# Patient Record
Sex: Male | Born: 1980
Health system: Southern US, Community
[De-identification: ages and names within clinical notes are randomized; demographics above are authoritative.]

## PROBLEM LIST (undated history)

## (undated) DIAGNOSIS — Z8619 Personal history of other infectious and parasitic diseases: Secondary | ICD-10-CM

## (undated) DIAGNOSIS — E669 Obesity, unspecified: Secondary | ICD-10-CM

## (undated) HISTORY — DX: Obesity, unspecified: E66.9

## (undated) HISTORY — DX: Personal history of other infectious and parasitic diseases: Z86.19

---
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
## 2001-11-19 ENCOUNTER — Emergency Department (HOSPITAL_COMMUNITY): Admission: EM | Admit: 2001-11-19 | Discharge: 2001-11-19 | Payer: Self-pay

## 2004-09-01 ENCOUNTER — Emergency Department: Payer: Self-pay | Admitting: Emergency Medicine

## 2004-09-01 IMAGING — CT CT HEAD WITHOUT CONTRAST
1 series · 16 of 30 positions shown, 20 images · non-contrast
Comparison: none

REASON FOR EXAM: Headache  [HOSPITAL]
COMMENTS:

[Series 2: without · axial · non-contrast · 0.42mm/px · z∈[+138,+272]mm · 16 of 30 slices shown, 20 images]
[im 2/30  brain]
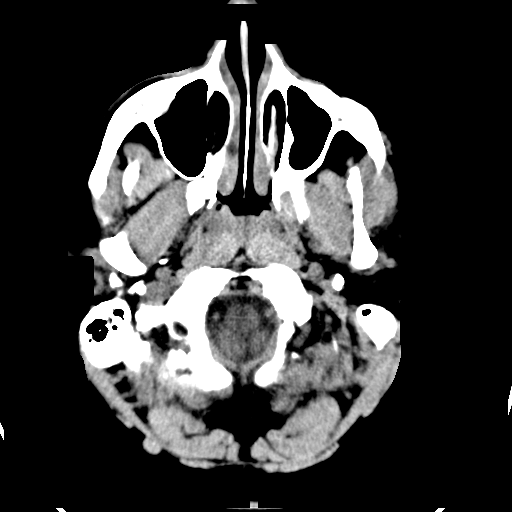
[im 2/30  bone]
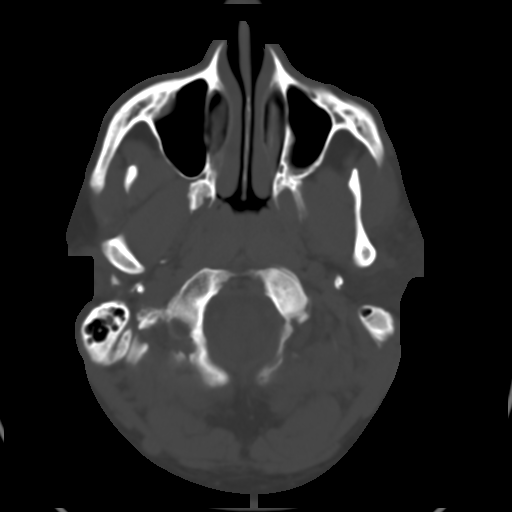
[im 4/30  brain]
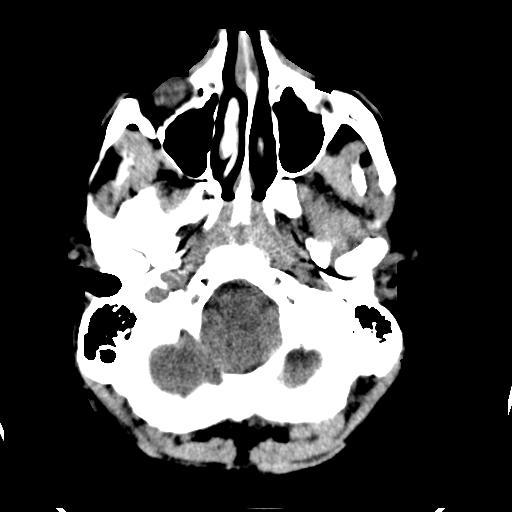
[im 6/30  brain]
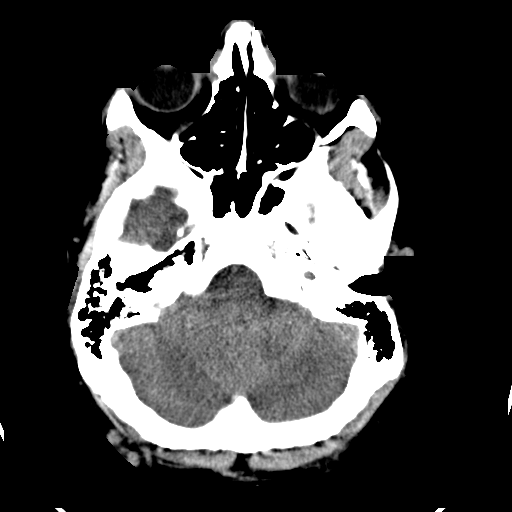
[im 8/30  brain]
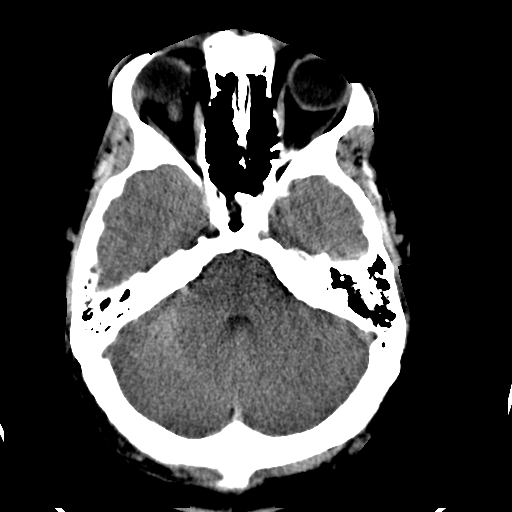
[im 9/30  brain]
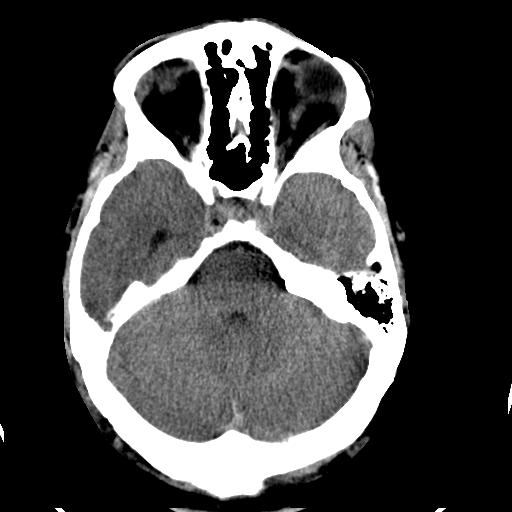
[im 9/30  bone]
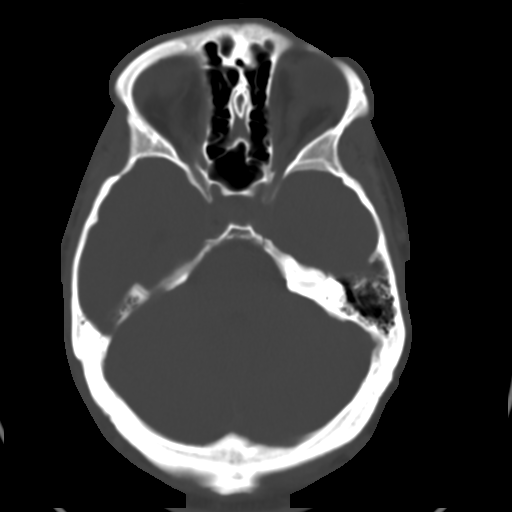
[im 11/30  brain]
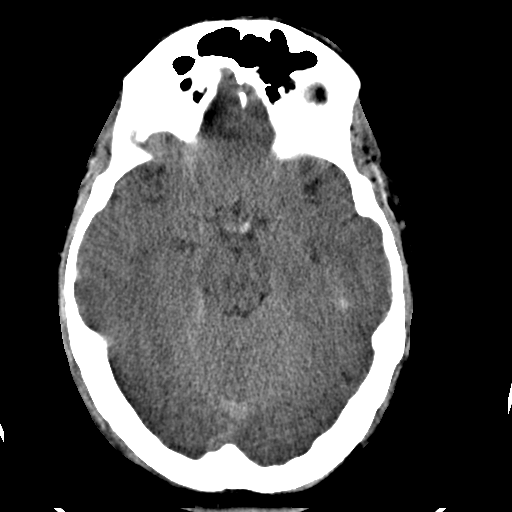
[im 13/30  brain]
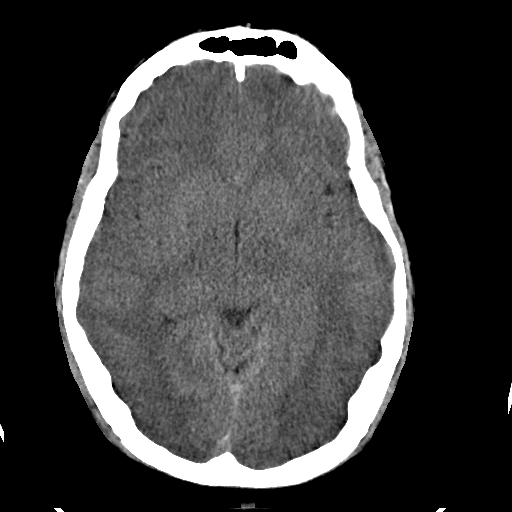
[im 15/30  brain]
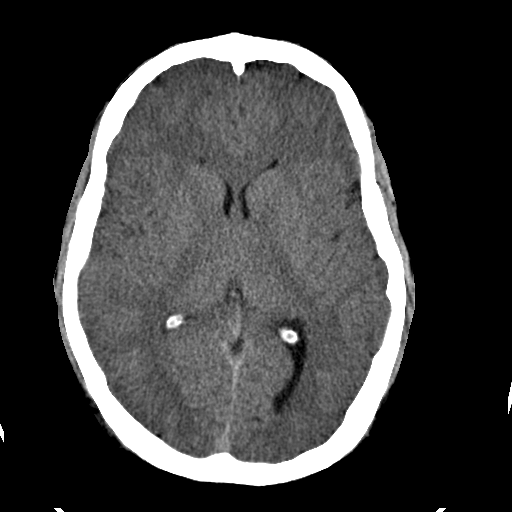
[im 16/30  brain]
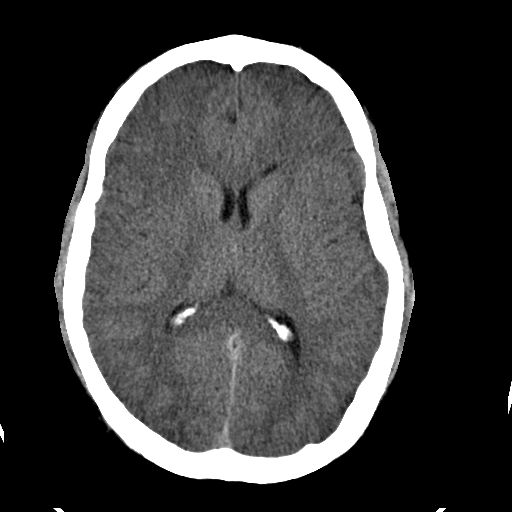
[im 16/30  bone]
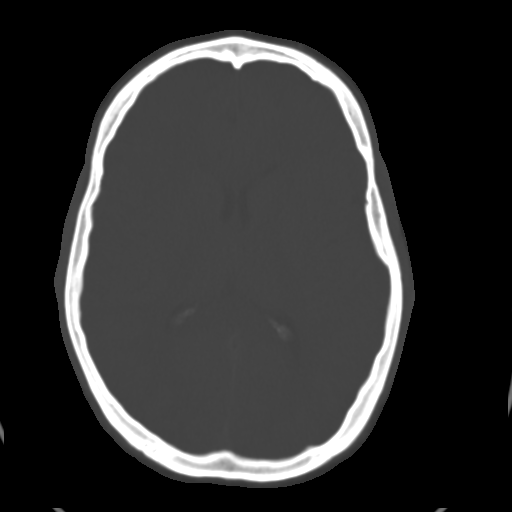
[im 18/30  brain]
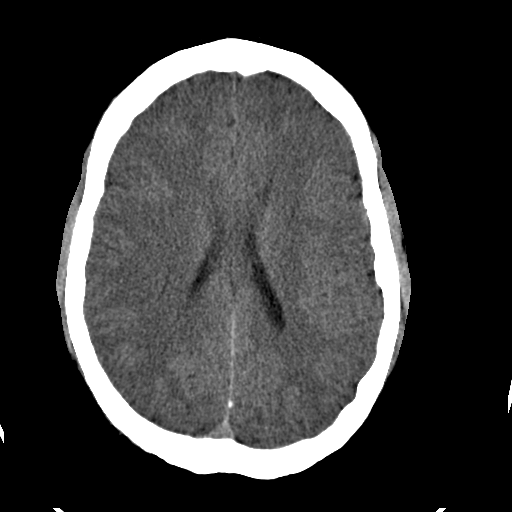
[im 20/30  brain]
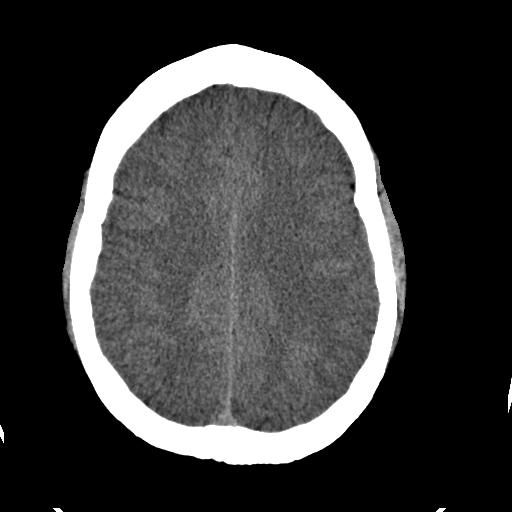
[im 22/30  brain]
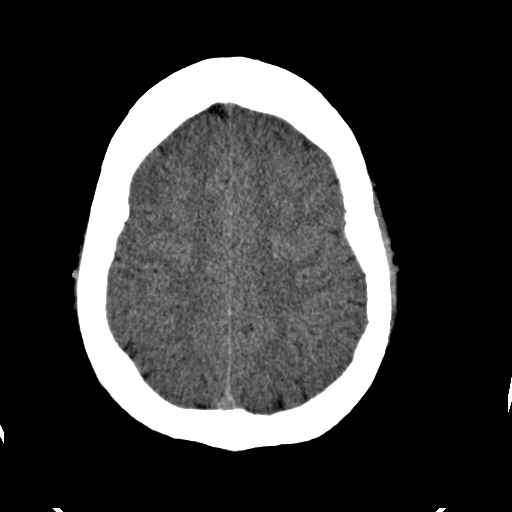
[im 23/30  brain]
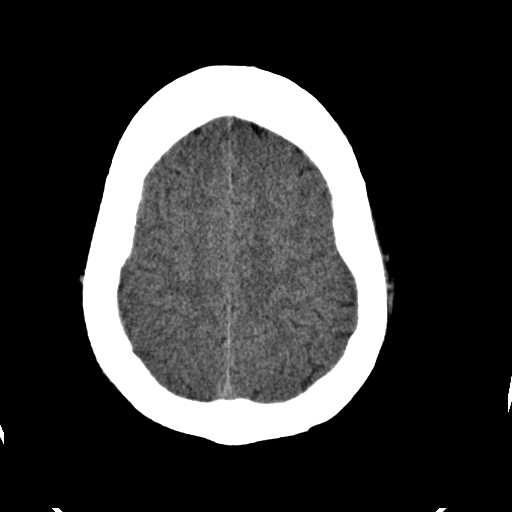
[im 23/30  bone]
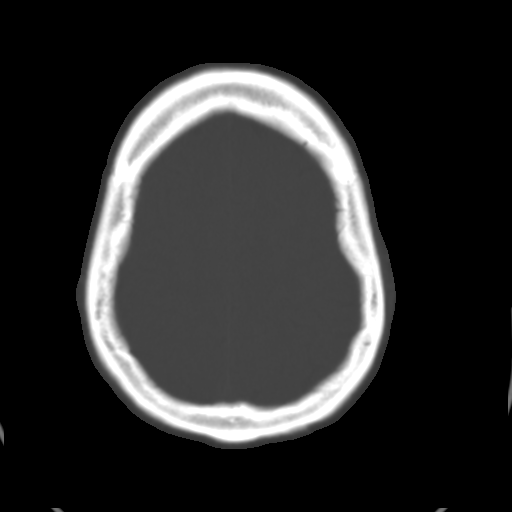
[im 25/30  brain]
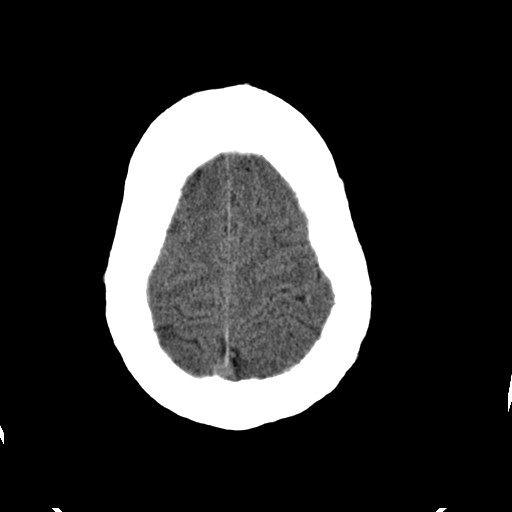
[im 27/30  brain]
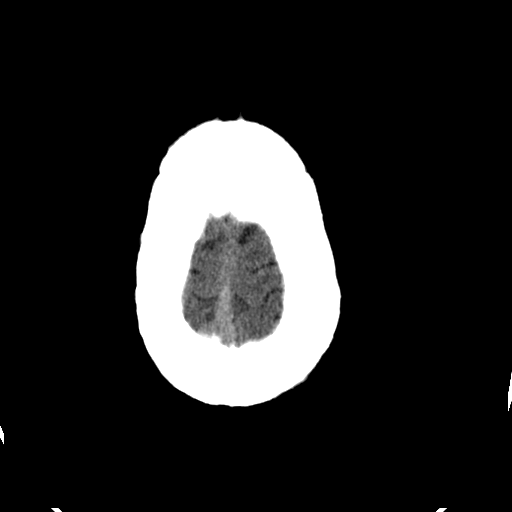
[im 29/30  brain]
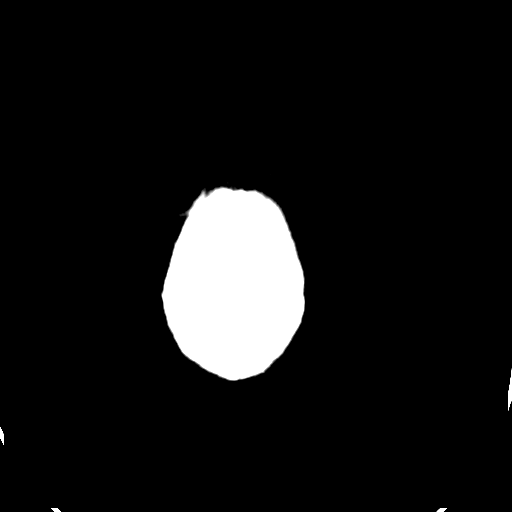

[16 of 30 positions shown; findings below may reference images not displayed]

PROCEDURE:     CT  - CT HEAD WITHOUT CONTRAST  - [DATE]  [DATE]

RESULT:     The patient is complaining of three days of headache.

The ventricles are normal in size and position.  I see no evidence of a mass
or of mass effect.  There are no findings suspicious for acute intracranial
hemorrhage.  At bone window settings there is minimal mucoperiosteal
thickening inferiorly in the LEFT maxillary sinus.  Otherwise, the paranasal
sinuses appear clear.
IMPRESSION: I do not see evidence of an acute intracranial abnormality.  Specifically I
do not see findings suspicious for hemorrhage or mass effect.  If the
patient's symptoms persist and remain unexplained, further evaluation with
MRI may be of value.

There is minimal mucoperiosteal thickening inferiorly in the LEFT maxillary
sinus.

The findings were called to the emergency room at the conclusion of the
study.

## 2004-09-02 ENCOUNTER — Ambulatory Visit: Payer: Self-pay | Admitting: Family Medicine

## 2005-07-22 ENCOUNTER — Ambulatory Visit: Payer: Self-pay | Admitting: Family Medicine

## 2005-08-01 ENCOUNTER — Ambulatory Visit: Payer: Self-pay | Admitting: Family Medicine

## 2006-09-05 ENCOUNTER — Emergency Department (HOSPITAL_COMMUNITY): Admission: EM | Admit: 2006-09-05 | Discharge: 2006-09-05 | Payer: Self-pay | Admitting: Family Medicine

## 2007-11-23 ENCOUNTER — Ambulatory Visit: Payer: Self-pay | Admitting: Internal Medicine

## 2007-11-23 DIAGNOSIS — J020 Streptococcal pharyngitis: Secondary | ICD-10-CM | POA: Insufficient documentation

## 2007-11-23 LAB — CONVERTED CEMR LAB: Rapid Strep: POSITIVE

## 2010-07-13 ENCOUNTER — Encounter
Admission: RE | Admit: 2010-07-13 | Discharge: 2010-07-13 | Payer: Self-pay | Source: Home / Self Care | Attending: Occupational Medicine | Admitting: Occupational Medicine

## 2010-07-13 IMAGING — CR DG CHEST 1V
1 series · 1 of 1 positions shown · non-contrast
Comparison: None.

CLINICAL DATA: Pre employment respiratory exam

CHEST - 1 VIEW

[w chest pa]
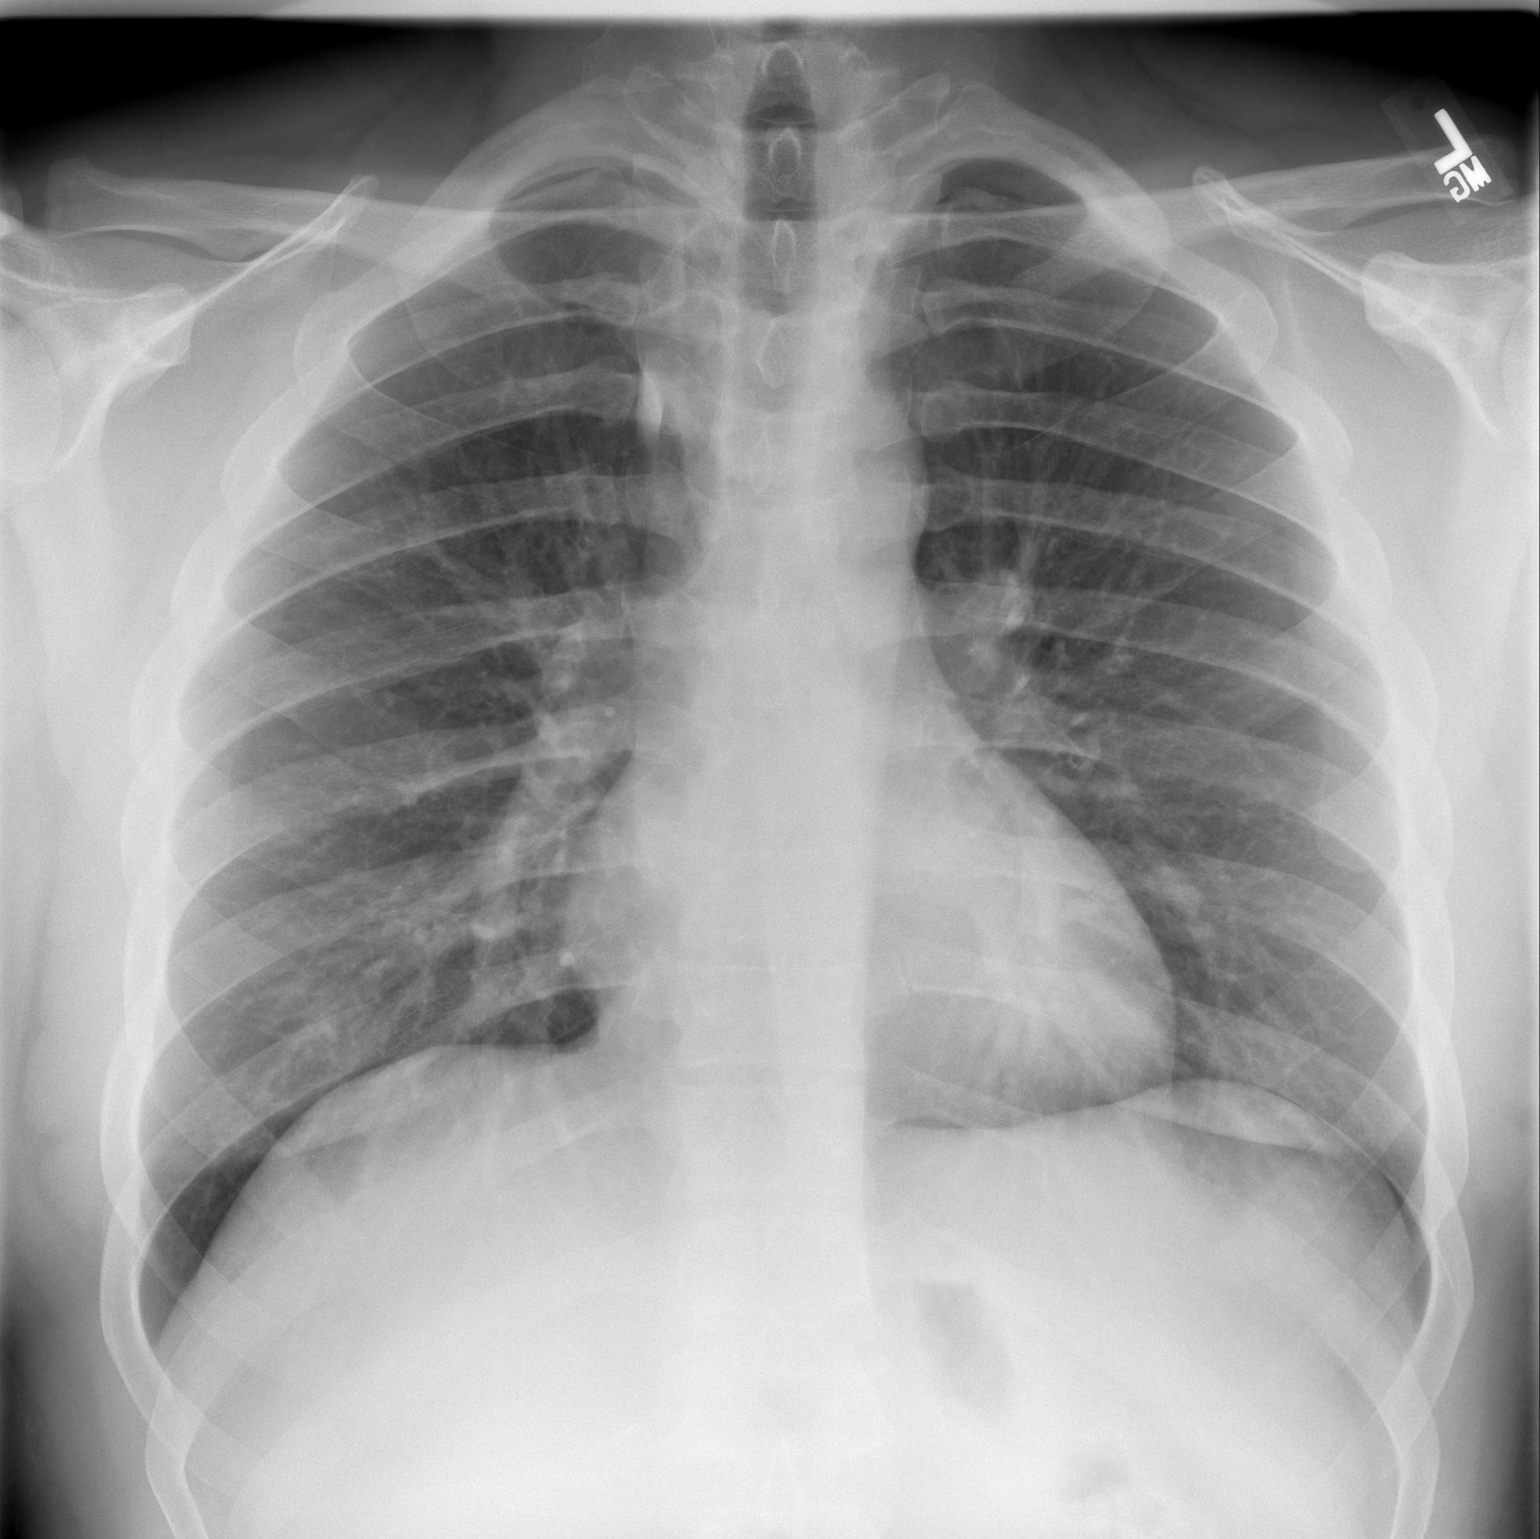

[1 of 1 positions shown; findings below may reference images not displayed]

FINDINGS: The cardiac silhouette, mediastinum, pulmonary
vasculature are within normal limits.  Both lungs are clear.
There is no acute bony abnormality.
IMPRESSION: There is no evidence of acute cardiac or pulmonary process.

## 2011-05-03 ENCOUNTER — Ambulatory Visit (INDEPENDENT_AMBULATORY_CARE_PROVIDER_SITE_OTHER): Payer: 59 | Admitting: Family Medicine

## 2011-05-03 ENCOUNTER — Encounter: Payer: Self-pay | Admitting: Family Medicine

## 2011-05-03 VITALS — BP 126/76 | HR 52 | Temp 98.6°F | Ht 76.0 in | Wt 337.8 lb

## 2011-05-03 DIAGNOSIS — R632 Polyphagia: Secondary | ICD-10-CM

## 2011-05-03 DIAGNOSIS — M542 Cervicalgia: Secondary | ICD-10-CM

## 2011-05-03 DIAGNOSIS — Z23 Encounter for immunization: Secondary | ICD-10-CM

## 2011-05-03 DIAGNOSIS — E669 Obesity, unspecified: Secondary | ICD-10-CM

## 2011-05-03 DIAGNOSIS — M545 Low back pain, unspecified: Secondary | ICD-10-CM | POA: Insufficient documentation

## 2011-05-03 LAB — GLUCOSE, POCT (MANUAL RESULT ENTRY): POC Glucose: 86

## 2011-05-03 MED ORDER — NAPROXEN 500 MG PO TABS: ORAL_TABLET | ORAL | Status: AC

## 2011-05-03 MED ORDER — CYCLOBENZAPRINE HCL 10 MG PO TABS: 10.0000 mg | ORAL_TABLET | Freq: Three times a day (TID) | ORAL | Status: AC | PRN

## 2011-05-03 NOTE — Progress Notes (Signed)
Subjective:    Patient ID: Melvin Hill, male    DOB: 1980/08/02, 30 y.o.   MRN: [id number]  HPI CC: new pt, re establish  Neck pain - 1-2 wk h/o neck pain described as constant tightness mid neck as well as down sides to shoulders.  Denies inciting trauma, injury.  No radiculopathy down arms or numbness, weakness.  Then back started hurting yesterday.  Lower back, left side.  No radiculopathy down legs, tingling or numbness down legs, fevers/chills, bowel or bladder changes, nausea/vomiting.  Back pain started after lifting something with waist yesterday.  Laying on back and pulling knees to chest helps a lot.  Similar episode of lower back pain happened 2 years ago when lifting something heavy with waist.  Treated back then with heat, icy hot, and slowly resolved.  Lasted 2 days.  Endorses polyphagia.  No polydipsia, polyuria.  Preventative: Did have CPE 07/2010, blood work checked.  Thinks had tetanus then. Would like flu shot. Had cereal today.  Medications and allergies reviewed and updated in chart.  Past histories reviewed and updated if relevant as below. Patient Active Problem List  Diagnoses  . PHARYNGITIS, STREPTOCOCCAL   Past Medical History  Diagnosis Date  . History of chicken pox   . Obesity    Past Surgical History  Procedure Date  . Anterior cruciate ligament repair 2001    Left knee   History  Substance Use Topics  . Smoking status: Never Smoker   . Smokeless tobacco: Never Used  . Alcohol Use: Yes     Very rare   Family History  Problem Relation Age of Onset  . Diabetes Father     borderline  . Diabetes Maternal Grandfather   . Stroke Maternal Grandfather   . Diabetes Maternal Grandmother   . Coronary artery disease Maternal Grandmother   . Cancer Neg Hx   . Hypertension Neg Hx    No Known Allergies No current outpatient prescriptions on file prior to visit.   Review of Systems  Constitutional: Negative for fever, chills, activity change,  appetite change, fatigue and unexpected weight change.  HENT: Negative for hearing loss and neck pain.   Eyes: Negative for visual disturbance.  Respiratory: Negative for cough, chest tightness, shortness of breath and wheezing.   Cardiovascular: Negative for chest pain, palpitations and leg swelling.  Gastrointestinal: Negative for nausea, vomiting, abdominal pain, diarrhea, constipation, blood in stool and abdominal distention.  Genitourinary: Negative for hematuria and difficulty urinating.  Musculoskeletal: Negative for myalgias and arthralgias.  Skin: Negative for rash.  Neurological: Negative for dizziness, seizures, syncope and headaches.  Hematological: Does not bruise/bleed easily.  Psychiatric/Behavioral: Negative for dysphoric mood. The patient is not nervous/anxious.        Objective:   Physical Exam  Nursing note and vitals reviewed. Constitutional: He is oriented to person, place, and time. He appears well-developed and well-nourished. No distress.       Obesity  HENT:  Head: Normocephalic and atraumatic.  Right Ear: External ear normal.  Left Ear: External ear normal.  Nose: Nose normal.  Mouth/Throat: Oropharynx is clear and moist.  Eyes: Conjunctivae and EOM are normal. Pupils are equal, round, and reactive to light.  Neck: Normal range of motion. Neck supple. No thyromegaly present.  Cardiovascular: Normal rate, regular rhythm, normal heart sounds and intact distal pulses.   No murmur heard. Pulses:      Radial pulses are 2+ on the right side, and 2+ on the left side.  Pulmonary/Chest: Effort normal and breath sounds normal. No respiratory distress. He has no wheezes. He has no rales.  Abdominal: Soft. Bowel sounds are normal. He exhibits no distension and no mass. There is no tenderness. There is no rebound and no guarding.  Musculoskeletal: Normal range of motion.       Mild midline spine tenderness at T1 Neg spurling. Tightness in traps bilaterally. FROM at  neck without pain or tightness. No midline spine lumbar tenderness. Tight paraspinous mm tenderness. Neg SLR.  No pain with int/ext rotation at hips, neg FABER test.  No GTB pain.  Mild SIJ pain bilaterally 5/5 strength bilat U and LE.  Lymphadenopathy:    He has no cervical adenopathy.  Neurological: He is alert and oriented to person, place, and time. He has normal strength. No sensory deficit.  Reflex Scores:      Bicep reflexes are 2+ on the right side and 2+ on the left side.      Patellar reflexes are 2+ on the right side and 2+ on the left side.      Achilles reflexes are 2+ on the right side and 2+ on the left side.      CN grossly intact, station and gait intact  Skin: Skin is warm and dry. No rash noted.  Psychiatric: He has a normal mood and affect. His behavior is normal. Judgment and thought content normal.   Random cbg = 86    Assessment & Plan:

## 2011-05-03 NOTE — Patient Instructions (Addendum)
Check on last blood work and tetanus shot to see when you're next due and let me know. Flu shot today. Random sugar today. Bring copy of recent blood work. I think you have cervical and lumbar strains.  I'd recommend anti inflammatory twice daily with food as well as muscle relaxant (can make you sleepy).  Continue ice/heat to area. If not improving, let me know.

## 2011-05-03 NOTE — Assessment & Plan Note (Signed)
Lumbar strain vs sacroiliitis.  Treat with nsaids and flexeril, ice/heat. Stretching exercises on lower back pain from [facility] pt advisor provided. No red flags. Update [place] if not improving as expected.

## 2011-05-03 NOTE — Assessment & Plan Note (Signed)
Discussed importance of weight loss in setting of fmhx DM, and back pain, as well as for prevention of arthritis, joint/disc damage. Pt seems to stay active, priority may be more healthy eating - watching portion sizes.  discussed this. Given endorsing polyphagia - checked random cbg = 86. Asked pt to bring copy of latest blood work (early this year) and I will review and see if needs any further testing.

## 2011-05-03 NOTE — Assessment & Plan Note (Signed)
Anticipate cervical strain. Provided with [facility] pt advisor stretches for this.  Treat with NSAIDs, flexeril, ice/heat. If not improved with this, obtain neck xray and consider PT referral. No red flags today.

## 2011-05-16 ENCOUNTER — Telehealth: Payer: Self-pay | Admitting: Family Medicine

## 2011-05-16 DIAGNOSIS — R7401 Elevation of levels of liver transaminase levels: Secondary | ICD-10-CM | POA: Insufficient documentation

## 2011-05-16 NOTE — Telephone Encounter (Signed)
Please notify I reviewed blood work from earlier this year - kidneys, thyroid, blood count normal. Cholesterol overall normal, but good chol a bit low - rec increased aerobic exercising. Liver enzymes were slightly elevated, would like to recheck this at his convenience.  Order in chart.

## 2011-05-17 NOTE — Telephone Encounter (Signed)
Patient notified. He will call back to schedule labs after he knows his schedule.

## 2014-03-18 ENCOUNTER — Ambulatory Visit (INDEPENDENT_AMBULATORY_CARE_PROVIDER_SITE_OTHER): Payer: 59 | Admitting: Internal Medicine

## 2014-03-18 ENCOUNTER — Encounter: Payer: Self-pay | Admitting: Internal Medicine

## 2014-03-18 VITALS — BP 142/68 | HR 63 | Temp 98.2°F | Wt 333.8 lb

## 2014-03-18 DIAGNOSIS — M542 Cervicalgia: Secondary | ICD-10-CM

## 2014-03-18 DIAGNOSIS — G44209 Tension-type headache, unspecified, not intractable: Secondary | ICD-10-CM

## 2014-03-18 MED ORDER — METHOCARBAMOL 500 MG PO TABS: 500.0000 mg | ORAL_TABLET | Freq: Four times a day (QID) | ORAL | Status: DC

## 2014-03-18 NOTE — Progress Notes (Signed)
   Subjective:    Patient ID: Melvin Hill, male    DOB: May 16, 1981, 33 y.o.   MRN: [id number]  HPI  Pt presents to the clinic today with c/o a headache. He reports this started 1 week ago. He describes the pain as a throbbing sensation. It is located behind his eyes and he feels tightness in his neck that radiates up to his head. He denies blurred vision. He denies nausea, sensitivity to light or sounds. He has tried Excedrin and Aleve without much improvement. He reports he has not had a headache like this since he was in college.  Review of Systems      Past Medical History  Diagnosis Date  . History of chicken pox   . Obesity     No current outpatient prescriptions on file.   No current facility-administered medications for this visit.    No Known Allergies  Family History  Problem Relation Age of Onset  . Diabetes Father     borderline  . Diabetes Maternal Grandfather   . Stroke Maternal Grandfather   . Diabetes Maternal Grandmother   . Coronary artery disease Maternal Grandmother   . Cancer Neg Hx   . Hypertension Neg Hx     History   Social History  . Marital Status: Single    Spouse Name: N/A    Number of Children: 0  . Years of Education: N/A   Occupational History  . Firefighter Unemployed   Social History Main Topics  . Smoking status: Never Smoker   . Smokeless tobacco: Never Used  . Alcohol Use: Yes     Comment: Very rare  . Drug Use: No  . Sexual Activity: Not on file   Other Topics Concern  . Not on file   Social History Narrative   Caffeine: some soda, tea 2-3 cups/day   Lives with fiancee, 1 dog   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     Constitutional: Pt reports headache. Denies fever, malaise, fatigue, headache or abrupt weight changes.  Neurological: Denies dizziness,  difficulty with memory, difficulty with speech or problems with balance and coordination.   No other specific complaints in a complete review of systems (except as listed in HPI above).  Objective:   Physical Exam   BP 142/68  Pulse 63  Temp(Src) 98.2 F (36.8 C) (Oral)  Wt 333 lb 12 oz (151.388 kg)  SpO2 98% Wt Readings from Last 3 Encounters:  03/18/14 333 lb 12 oz (151.388 kg)  05/03/11 337 lb 12 oz (153.202 kg)  11/23/07 314 lb (142.429 kg)    General: Appears his stated age, well developed, well nourished in NAD. Cardiovascular: Normal rate and rhythm. S1,S2 noted.  No murmur, rubs or gallops noted. No JVD or BLE edema. No carotid bruits noted. Pulmonary/Chest: Normal effort and positive vesicular breath sounds. No respiratory distress. No wheezes, rales or ronchi noted.   Neurological: Alert and oriented.  Coordination normal. .        Assessment & Plan:   Headache and neck pain secondary to tension:  80 mg Depo IM today eRx for Robaxin to take as needed  RTC as needed or if pain persist or worsens

## 2014-03-18 NOTE — Patient Instructions (Addendum)

## 2014-03-18 NOTE — Progress Notes (Signed)
Pre visit review using our clinic review tool, if applicable. No additional management support is needed unless otherwise documented below in the visit note. 

## 2014-10-01 ENCOUNTER — Ambulatory Visit (INDEPENDENT_AMBULATORY_CARE_PROVIDER_SITE_OTHER): Payer: 59 | Admitting: Family Medicine

## 2014-10-01 ENCOUNTER — Encounter: Payer: Self-pay | Admitting: Family Medicine

## 2014-10-01 VITALS — BP 110/82 | HR 56 | Temp 98.1°F | Wt 340.0 lb

## 2014-10-01 DIAGNOSIS — M79643 Pain in unspecified hand: Secondary | ICD-10-CM | POA: Insufficient documentation

## 2014-10-01 DIAGNOSIS — R2232 Localized swelling, mass and lump, left upper limb: Secondary | ICD-10-CM | POA: Insufficient documentation

## 2014-10-01 DIAGNOSIS — M25529 Pain in unspecified elbow: Secondary | ICD-10-CM | POA: Insufficient documentation

## 2014-10-01 NOTE — Assessment & Plan Note (Signed)
Anticipate wear and tear OA type changes. rec trial glucosamine or vitamin D supplements and 1 wk course aleve 220mg  bid with food. Update with effect or if worsening pain.

## 2014-10-01 NOTE — Patient Instructions (Addendum)
I think we have several things going on: For finger nodule - let's monitor for now. If not resolved we could refer you to hand doctor for further eval/treatment. For arthritis type pain of finger joints - try vitamin D 1000 units or glucosamine supplement daily for 1 month to see if any improvement. May use advil/aleve as needed. For elbow pain - I think we have ulnar neuropathy bilaterally and left medial epicondylitis. Try exercises provided today. If not better, let me know. Start B12 vitamin or B complex vitamin for nerve health.

## 2014-10-01 NOTE — Progress Notes (Signed)
Pre visit review using our clinic review tool, if applicable. No additional management support is needed unless otherwise documented below in the visit note. 

## 2014-10-01 NOTE — Progress Notes (Signed)
BP 110/82 mmHg  Pulse 56  Temp(Src) 98.1 F (36.7 C) (Oral)  Wt 340 lb (154.223 kg)   CC: joint pains  Subjective:    Patient ID: Melvin Hill, male    DOB: [birth year], 34 y.o.   MRN: [id number]  HPI: Melvin Hill is a 34 y.o. male presenting on 10/01/2014 for Joint Pain   Not seen by myself since 2012.  Ongoing issue over last year of bilateral inner elbows R>L worse with lifting heavy equipment or closed grip pull-ups. Notices some paresthesias/numbness along ulnar distribution with prolonged writing or with prolonged flexion of arm. Also with finger joint weakness (thinks has decreased grip strength). Mainly R 3rd MCP. Even notices discomfort when picking up jug of milk or opening gatorade cap. Over last 1-2 mo noted small nodule noted L medial 3rd digit between MCP and PIP. Wedding band irritates nodule. Getting worse.  Pressure on mid forearm helps pain.  Sleeps on abdomen. Forearm paresthesias wake him up.  Denies inciting trauma/injury. No active synovitis, no weakness of arms, no rash, no red eyes.   Has tried no meds - doesn't like meds for this.   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 Interim medical history since our last visit reviewed. Allergies and medications reviewed and updated. No current outpatient prescriptions on file prior to visit.   No current facility-administered medications on file prior to visit.    Review of Systems Per HPI unless specifically indicated above     Objective:    BP 110/82 mmHg  Pulse 56  Temp(Src) 98.1 F (36.7 C) (Oral)  Wt 340 lb (154.223 kg)  Wt Readings from Last 3 Encounters:  10/01/14 340 lb (154.223 kg)  03/18/14 333 lb 12 oz (151.388 kg)  05/03/11 337 lb 12 oz (153.202 kg)    Physical Exam  Constitutional: He is oriented to person, place, and time. He appears  well-developed and well-nourished. No distress.  Muscular/obese  Musculoskeletal: He exhibits no edema.  No active synovitis - no joint inflammation - erythema edema or swelling noted. Tender to palpation L medial epicondyle.  FROM elbows, wrist, fingers  Neurological: He is alert and oriented to person, place, and time.  Reflex Scores:      Bicep reflexes are 1+ on the right side and 1+ on the left side. Grip strength intact. + paresthesias with percussion bilateral ulnar tunnels. No neck pain.  Skin: Skin is warm and dry. No rash noted.  Nursing note and vitals reviewed.  Results for orders placed or performed in visit on 05/03/11  POCT Glucose (CBG)  Result Value Ref Range   POC Glucose 86       Assessment & Plan:   Problem List Items Addressed This Visit    Hand pain    Anticipate wear and tear OA type changes. rec trial glucosamine or vitamin D supplements and 1 wk course aleve  bid with food. Update with effect or if worsening pain.      Finger mass, left    L 3rd medial digit between MCP and PIP. ?cyst vs other mass.  Actually has decreased in size since he stopped wearing wedding band. Pt will monitor for now and if becoming more bothersome will update [place] for referral to hand surgery. No indication of infection today.      Elbow pain - Primary    Anticipate  2 issues ongoing:  1. Bilateral ulnar neuropathy - suggested start vit B complex. Discussed if not better with this could consider NCS vs referral to ortho for further eval of compressive anatomy and need for surgical intervention. Currently mild. 2. L medial epicondylitis - placed in tennis elbow brace. rec aleve 220mg  bid x 1 wk with food then prn, update [place] with effect.          Follow up plan: Return if symptoms worsen or fail to improve.

## 2014-10-01 NOTE — Assessment & Plan Note (Signed)
L 3rd medial digit between MCP and PIP. ?cyst vs other mass.  Actually has decreased in size since he stopped wearing wedding band. Pt will monitor for now and if becoming more bothersome will update [place] for referral to hand surgery. No indication of infection today.

## 2014-10-01 NOTE — Assessment & Plan Note (Signed)
Anticipate 2 issues ongoing:  1. Bilateral ulnar neuropathy - suggested start vit B complex. Discussed if not better with this could consider NCS vs referral to ortho for further eval of compressive anatomy and need for surgical intervention. Currently mild. 2. L medial epicondylitis - placed in tennis elbow brace. rec aleve 220mg  bid x 1 wk with food then prn, update [place] with effect.

## 2015-06-01 ENCOUNTER — Encounter: Payer: Self-pay | Admitting: Family Medicine

## 2015-06-01 ENCOUNTER — Ambulatory Visit (INDEPENDENT_AMBULATORY_CARE_PROVIDER_SITE_OTHER): Payer: 59 | Admitting: Family Medicine

## 2015-06-01 VITALS — BP 116/74 | HR 94 | Temp 98.5°F | Wt 345.4 lb

## 2015-06-01 DIAGNOSIS — R6883 Chills (without fever): Secondary | ICD-10-CM | POA: Diagnosis not present

## 2015-06-01 DIAGNOSIS — B349 Viral infection, unspecified: Secondary | ICD-10-CM | POA: Insufficient documentation

## 2015-06-01 LAB — POCT INFLUENZA A/B
Influenza A, POC: NEGATIVE
Influenza B, POC: NEGATIVE

## 2015-06-01 NOTE — Progress Notes (Signed)
Patient ID: Melvin Hill, male   DOB: 1980/12/24, 34 y.o.   MRN: [id number]  Marikay Alar, MD Phone: [phone]  Melvin Hill is a 34 y.o. male who presents today for same-day visit.  Patient notes onset of nasal congestion and postnasal drip last night. He noted a mild sinus pressure headache with this as well. Then today while at work he developed sore throat, chills and sweats, nausea, dry heaving, and fatigue. He notes mild frontal headache that is described as sinus pressure. Has not had any fevers documented. He has not vomited or had diarrhea. He has not had abdominal pain. Does note feeling achy all over though particularly in his low back. He notes this is similar to his prior history of back pain. He has not had any numbness or weakness. He has not had saddle anesthesia, bowel or bladder incontinence, or history of cancer. Has not had any vision changes. Denies urinary issues. 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 He has had no chest pain. No shortness of breath at this time. Overall feels tired. Daughter is his sick contact. States this feels like his prior viral illnesses.  PMH: nonsmoker.   ROS see history of present illness  Objective  Physical Exam Filed Vitals:   06/01/15 1405  BP: 116/74  Pulse: 94  Temp: 98.5 F (36.9 C)    Physical Exam  Constitutional:  No acute distress, nontoxic appearing, appears tired  HENT:  Head: Normocephalic and atraumatic.  Right Ear: External ear normal.  Left Ear: External ear normal.  Mouth/Throat: Oropharynx is clear and moist. No oropharyngeal exudate.  Normal TMs bilaterally, no tenderness to percussion of frontal and maxillary sinuses  Eyes: Conjunctivae are normal. Pupils are equal, round, and reactive to light.  Neck: Normal range of motion. Neck supple.  Cardiovascular: Normal rate, regular rhythm and normal heart sounds.  Exam reveals no gallop and no friction rub.     No murmur heard. Pulmonary/Chest: Effort normal and breath sounds normal. No respiratory distress. He has no wheezes. He has no rales.  Abdominal: Soft. Bowel sounds are normal. He exhibits no distension. There is no tenderness. There is no rebound and no guarding.  Musculoskeletal: He exhibits no edema.  No midline spine or neck tenderness, no spine step-off, no muscular back tenderness, no swelling in his back or erythema  Lymphadenopathy:    He has no cervical adenopathy.  Neurological: He is alert. Gait normal.  CN 2-12 intact, 5/5 strength in bilateral biceps, triceps, grip, quads, hamstrings, plantar and dorsiflexion, sensation to light touch intact in bilateral UE and LE, normal gait, 2+ patellar reflexes  Skin: Skin is warm and dry.     Assessment/Plan: Please see individual problem list.  Viral illness History consistent with viral upper respiratory flulike illness. No focal findings on exam to indicate bacterial infection. Patient's vitals are stable and he is afebrile. He is nontoxic appearing. Rapid flu negative. Had discussion with patient that this is likely a viral illness. Advised on hydration and ibuprofen for aching and fever. Advised on hygiene. He is given return precautions. He is not improved in the next 1-2 days he'll follow-up in the office.    Orders Placed This Encounter  Procedures  . POCT Influenza A/B   Marikay Alar

## 2015-06-01 NOTE — Progress Notes (Signed)
Pre visit review using our clinic review tool, if applicable. No additional management support is needed unless otherwise documented below in the visit note. 

## 2015-06-01 NOTE — Assessment & Plan Note (Addendum)
History consistent with viral upper respiratory flulike illness. No focal findings on exam to indicate bacterial infection. Patient's vitals are stable and he is afebrile. He is nontoxic appearing. Rapid flu negative. Had discussion with patient that this is likely a viral illness. Advised on hydration and ibuprofen for aching and fever. Advised on hygiene. He is given return precautions. He is not improved in the next 1-2 days he'll follow-up in the office.

## 2015-06-01 NOTE — Patient Instructions (Signed)
Nice to meet you. Your symptoms are consistent with a viral flu like illness.  Please stay well hydrated. You can take ibuprofen for any discomfort. If you develop fevers, shortness of breath, chest pain, abdominal pain, diarrhea, vomiting, headaches, vision changes, numbness, weakness, back pain, rash, or any new or change in symptoms please seek medical attention. If not improving in the next 1-2 days please follow-up.

## 2016-08-08 ENCOUNTER — Telehealth: Payer: Self-pay | Admitting: Family Medicine

## 2016-08-08 NOTE — Telephone Encounter (Signed)
Noted. Scheduled appointment for 08/09/16.

## 2016-08-08 NOTE — Telephone Encounter (Signed)
[facility] Day - Client TELEPHONE ADVICE RECORD [facility] Patient Name: Melvin Hill DOB: [birth year] Initial Comment Caller states, has had a stuffy nose for two weeks and sinus pain. For two days he has a cough and last night he had chills. Chest cold. Headache. Verified Nurse Assessment Nurse: Debera Lat, RN, Tinnie Gens Date/Time Lamount Cohen Time): 08/08/2016 10:40:21 AM Confirm and document reason for call. If symptomatic, describe symptoms. ---Caller states, has had a stuffy nose for two weeks and sinus pain. For two days he has a cough and last night he had chills. Chest cold. No fever. Does the patient have any new or worsening symptoms? ---Yes Will a triage be completed? ---Yes Related visit to physician within the last 2 weeks? ---No Does the PT have any chronic conditions? (i.e. diabetes, asthma, etc.) ---No Is this a behavioral health or substance abuse call? ---No Guidelines Guideline Title Affirmed Question Affirmed Notes Sinus Pain or Congestion [1] Sinus congestion (pressure, fullness) AND [2] present > 10 days Final Disposition User See PCP When Office is Open (within 3 days) Debera Lat, RN, [employer] [facility] Referrals REFERRED TO PCP OFFICE Disagree/Comply: Danella Maiers

## 2016-08-09 ENCOUNTER — Encounter: Payer: Self-pay | Admitting: Primary Care

## 2016-08-09 ENCOUNTER — Ambulatory Visit (INDEPENDENT_AMBULATORY_CARE_PROVIDER_SITE_OTHER): Payer: 59 | Admitting: Primary Care

## 2016-08-09 VITALS — BP 124/74 | HR 68 | Temp 98.0°F | Wt 334.0 lb

## 2016-08-09 DIAGNOSIS — J019 Acute sinusitis, unspecified: Secondary | ICD-10-CM

## 2016-08-09 DIAGNOSIS — B9689 Other specified bacterial agents as the cause of diseases classified elsewhere: Secondary | ICD-10-CM

## 2016-08-09 MED ORDER — AMOXICILLIN-POT CLAVULANATE 875-125 MG PO TABS: 1.0000 | ORAL_TABLET | Freq: Two times a day (BID) | ORAL | 0 refills | Status: DC

## 2016-08-09 NOTE — Progress Notes (Signed)
Pre visit review using our clinic review tool, if applicable. No additional management support is needed unless otherwise documented below in the visit note. 

## 2016-08-09 NOTE — Progress Notes (Signed)
Subjective:    Patient ID: Melvin Hill, male    DOB: 08-May-1981, 36 y.o.   MRN: [id number]  HPI  Mr. Brakeman is a 36 year old male who presents today with a chief complaint of sinus pressure. He also reports nasal congestion, cough, ear fullness, fatigue, chills, night sweats. His symptoms have been present for the past 2 weeks. He's noticed increase in symptoms with productive cough with green sputum, chills over the past 2 days. He's been using Afrin QID and taking Dayquil without improvement. He's been exposed to a lot of sick people through his occupation and also through his young daughter.  Review of Systems  Constitutional: Positive for chills. Negative for fever.  HENT: Positive for congestion, sinus pain and sinus pressure. Negative for ear pain and sore throat.   Respiratory: Positive for cough. Negative for shortness of breath.   Cardiovascular: Negative for chest pain.       Past Medical History:  Diagnosis Date  . History of chicken pox   . Obesity      Social History   Social History  . Marital status: Single    Spouse name: N/A  . Number of children: 0  . Years of education: N/A   Occupational History  . Firefighter Unemployed   Social History Main Topics  . Smoking status: Never Smoker  . Smokeless tobacco: Never Used  . Alcohol use Yes     Comment: Very rare  . Drug use: No  . Sexual activity: Not on file   Other Topics Concern  . Not on file   Social History Narrative   Caffeine: some soda, tea 2-3 cups/day   Lives with fiancee, 1 dog   Was football lineman during college   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    Past Surgical History:  Procedure Laterality Date  . ANTERIOR CRUCIATE LIGAMENT REPAIR  2001   Left knee    Family History  Problem Relation Age of Onset  . Diabetes  Father     borderline  . Diabetes Maternal Grandfather   . Stroke Maternal Grandfather   . Diabetes Maternal Grandmother   . Coronary artery disease Maternal Grandmother   . Cancer Neg Hx   . Hypertension Neg Hx     No Known Allergies  No current outpatient prescriptions on file prior to visit.   No current facility-administered medications on file prior to visit.     BP 124/74   Pulse 68   Temp 98 F (36.7 C) (Oral)   Wt (!) 334 lb (151.5 kg)   SpO2 98%   BMI 40.66 kg/m    Objective:   Physical Exam  Constitutional: He appears well-nourished. He appears ill.  HENT:  Right Ear: Tympanic membrane and ear canal normal.  Left Ear: Tympanic membrane and ear canal normal.  Nose: Mucosal edema present. Right sinus exhibits maxillary sinus tenderness and frontal sinus tenderness. Left sinus exhibits no maxillary sinus tenderness and no frontal sinus tenderness.  Mouth/Throat: Posterior oropharyngeal erythema present.  Eyes: Conjunctivae are normal.  Neck: Neck supple.  Cardiovascular: Normal rate and regular rhythm.   Pulmonary/Chest: Effort normal and breath sounds normal. He has no wheezes. He has no rales.  Skin: Skin is warm and dry.          Assessment & Plan:  Acute  Sinusitis:  Sinus pressure, nasal congestion x 2 weeks. Now with productive cough. Exam today with sinus tenderness and nasal mucosal edema.  Given duration of symptoms and presentation, will treat for presumed bacterial involvement.  Rx for Augmentin course sent to pharmacy. Stop Afrin, try Flonase. Fluids, rest, follow up PRN.  Morrie Sheldon, NP

## 2016-08-09 NOTE — Patient Instructions (Signed)
You have a sinus infection.  Start Augmentin antibiotics. Take 1 tablet by mouth twice daily for 10 days with food.  Nasal Congestion/Sinus Pressure: Try using Flonase (fluticasone) nasal spray. Instill 1 spray in each nostril twice daily.   Continue Dayquil as needed for cough.  Ensure you're staying hydrated with water and rest.  It was a pleasure meeting you!

## 2018-06-08 ENCOUNTER — Ambulatory Visit: Payer: 59 | Admitting: Family Medicine

## 2018-06-08 DIAGNOSIS — Z0289 Encounter for other administrative examinations: Secondary | ICD-10-CM

## 2019-05-20 ENCOUNTER — Telehealth: Payer: Self-pay | Admitting: Family Medicine

## 2019-05-20 NOTE — Telephone Encounter (Signed)
E-mail sent to billing requesting information on if account can be recalled from collection agency.

## 2019-05-20 NOTE — Telephone Encounter (Signed)
Pt called stating he was sent to collections for 06/08/18.  he never received a bill  for the no show.  He stated this has dropped is credit score like 40 points.  He wanted to know if there was anything we could do about this.    I did look and his address was incorrect on the guarantee account.

## 2019-05-28 NOTE — Telephone Encounter (Signed)
I spoke with the patient. He stated that he moved in 2017. His last appointment with our office was in 2018 at which time he gave an updated address. He is not sure why it was not changed in the system. I will reach back out to patient accounting with the information received today to see if bill can be pulled from collections.

## 2019-05-28 NOTE — Telephone Encounter (Signed)
Left message on voicemail for patient to call the office. Need to inform patient that per patient accounting statements were sent out 12/15, 1/14, 2/13, & 3/15 to address [facility] [address], Altha Harm. We did not receive returned mail. The account has been in collections since April  and usually for that length of time the agency would have attempted to make contact. The account would not be returned from the agency.

## 2019-05-31 NOTE — Telephone Encounter (Signed)
Left message for patient to call office. Need to inform patient that he can call patient accounting to make his payment. Once the payment is made patient accounting will pull the account from collections.

## 2019-05-31 NOTE — Telephone Encounter (Signed)
I spoke with the patient and informed him that he would need to call patient accounting at [phone] to make his payment. Once the payment is made patient accounting will pull the account back from the agency. Patient understood.

## 2019-10-24 ENCOUNTER — Telehealth: Payer: Self-pay

## 2019-10-24 NOTE — Telephone Encounter (Signed)
Noted. Thanks.

## 2019-10-24 NOTE — Telephone Encounter (Signed)
Noted  

## 2019-10-24 NOTE — Telephone Encounter (Signed)
Pt scheduled 4/26 appointment with debbie Is aware of brooke's comment Pt stated he would try to find urgent care and go.  He didn't know which urgent care he would go to

## 2019-10-24 NOTE — Telephone Encounter (Signed)
Patient contacted the office this morning asking for an appointment to discuss his blood sugars. He states he has been checking his blood sugars in morning before he eats and he states they are running in the 130's and he states he has been feeling very groggy, with some slight abdominal pain. I advised patient I would need to speak with my manager to see what we could do for him, to get him taken care of appropriately - as it looks like the last time he was seen in the office in 2018, so he is considered a new patient.  After speaking with Carollee Herter, RN - she advised that patient could be scheduled for a NP appointment with one of our available providers, but for this issue concering his blood sugars, he needs to be seen at an Urgent Care.  I left a voicemail for patient to return phone call.

## 2019-10-26 ENCOUNTER — Other Ambulatory Visit: Payer: Self-pay

## 2019-10-26 ENCOUNTER — Emergency Department
Admission: EM | Admit: 2019-10-26 | Discharge: 2019-10-26 | Disposition: A | Discharge status: Home / Self Care | Payer: 59 | Attending: Emergency Medicine | Admitting: Emergency Medicine

## 2019-10-26 ENCOUNTER — Encounter: Payer: Self-pay | Admitting: Emergency Medicine

## 2019-10-26 ENCOUNTER — Emergency Department: Payer: 59

## 2019-10-26 DIAGNOSIS — R1030 Lower abdominal pain, unspecified: Secondary | ICD-10-CM | POA: Insufficient documentation

## 2019-10-26 DIAGNOSIS — R112 Nausea with vomiting, unspecified: Secondary | ICD-10-CM | POA: Insufficient documentation

## 2019-10-26 DIAGNOSIS — R1084 Generalized abdominal pain: Secondary | ICD-10-CM | POA: Insufficient documentation

## 2019-10-26 DIAGNOSIS — K5900 Constipation, unspecified: Secondary | ICD-10-CM

## 2019-10-26 DIAGNOSIS — R140 Abdominal distension (gaseous): Secondary | ICD-10-CM | POA: Insufficient documentation

## 2019-10-26 DIAGNOSIS — R197 Diarrhea, unspecified: Secondary | ICD-10-CM | POA: Insufficient documentation

## 2019-10-26 LAB — URINALYSIS, COMPLETE (UACMP) WITH MICROSCOPIC
Bacteria, UA: NONE SEEN
Bilirubin Urine: NEGATIVE
Glucose, UA: NEGATIVE mg/dL
Hgb urine dipstick: NEGATIVE
Ketones, ur: 5 mg/dL — AB
Leukocytes,Ua: NEGATIVE
Nitrite: NEGATIVE
Protein, ur: NEGATIVE mg/dL
Specific Gravity, Urine: 1.029 (ref 1.005–1.030)
Squamous Epithelial / HPF: NONE SEEN (ref 0–5)
pH: 5 (ref 5.0–8.0)

## 2019-10-26 LAB — CBC WITH DIFFERENTIAL/PLATELET
Abs Immature Granulocytes: 0.02 10*3/uL (ref 0.00–0.07)
Basophils Absolute: 0 10*3/uL (ref 0.0–0.1)
Basophils Relative: 1 %
Eosinophils Absolute: 0 10*3/uL (ref 0.0–0.5)
Eosinophils Relative: 1 %
HCT: 43.7 % (ref 39.0–52.0)
Hemoglobin: 15.3 g/dL (ref 13.0–17.0)
Immature Granulocytes: 0 %
Lymphocytes Relative: 31 %
Lymphs Abs: 2.4 10*3/uL (ref 0.7–4.0)
MCH: 30.1 pg (ref 26.0–34.0)
MCHC: 35 g/dL (ref 30.0–36.0)
MCV: 85.9 fL (ref 80.0–100.0)
Monocytes Absolute: 0.6 10*3/uL (ref 0.1–1.0)
Monocytes Relative: 7 %
Neutro Abs: 4.7 10*3/uL (ref 1.7–7.7)
Neutrophils Relative %: 60 %
Platelets: 294 10*3/uL (ref 150–400)
RBC: 5.09 MIL/uL (ref 4.22–5.81)
RDW: 12.4 % (ref 11.5–15.5)
WBC: 7.8 10*3/uL (ref 4.0–10.5)
nRBC: 0 % (ref 0.0–0.2)

## 2019-10-26 LAB — COMPREHENSIVE METABOLIC PANEL
ALT: 36 U/L (ref 0–44)
AST: 37 U/L (ref 15–41)
Albumin: 4.6 g/dL (ref 3.5–5.0)
Alkaline Phosphatase: 41 U/L (ref 38–126)
Anion gap: 7 (ref 5–15)
BUN: 19 mg/dL (ref 6–20)
CO2: 27 mmol/L (ref 22–32)
Calcium: 9.4 mg/dL (ref 8.9–10.3)
Chloride: 105 mmol/L (ref 98–111)
Creatinine, Ser: 1.04 mg/dL (ref 0.61–1.24)
GFR calc Af Amer: >60 mL/min (ref >60)
GFR calc non Af Amer: >60 mL/min (ref >60)
Glucose, Bld: 101 mg/dL — ABNORMAL HIGH (ref 70–99)
Potassium: 4.2 mmol/L (ref 3.5–5.1)
Sodium: 139 mmol/L (ref 135–145)
Total Bilirubin: 1.1 mg/dL (ref 0.3–1.2)
Total Protein: 7.2 g/dL (ref 6.5–8.1)

## 2019-10-26 LAB — LIPASE, BLOOD: Lipase: 21 U/L (ref 11–51)

## 2019-10-26 IMAGING — CR DG ABDOMEN 1V
1 series · 2 of 2 positions shown · non-contrast
Comparison: None.

CLINICAL DATA: Abdominal pain, constipation

EXAM:
ABDOMEN - 1 VIEW

[Series 1: dg abd 1 view · 0.14mm/px · 2 of 2 slices shown]
[im 1/2]
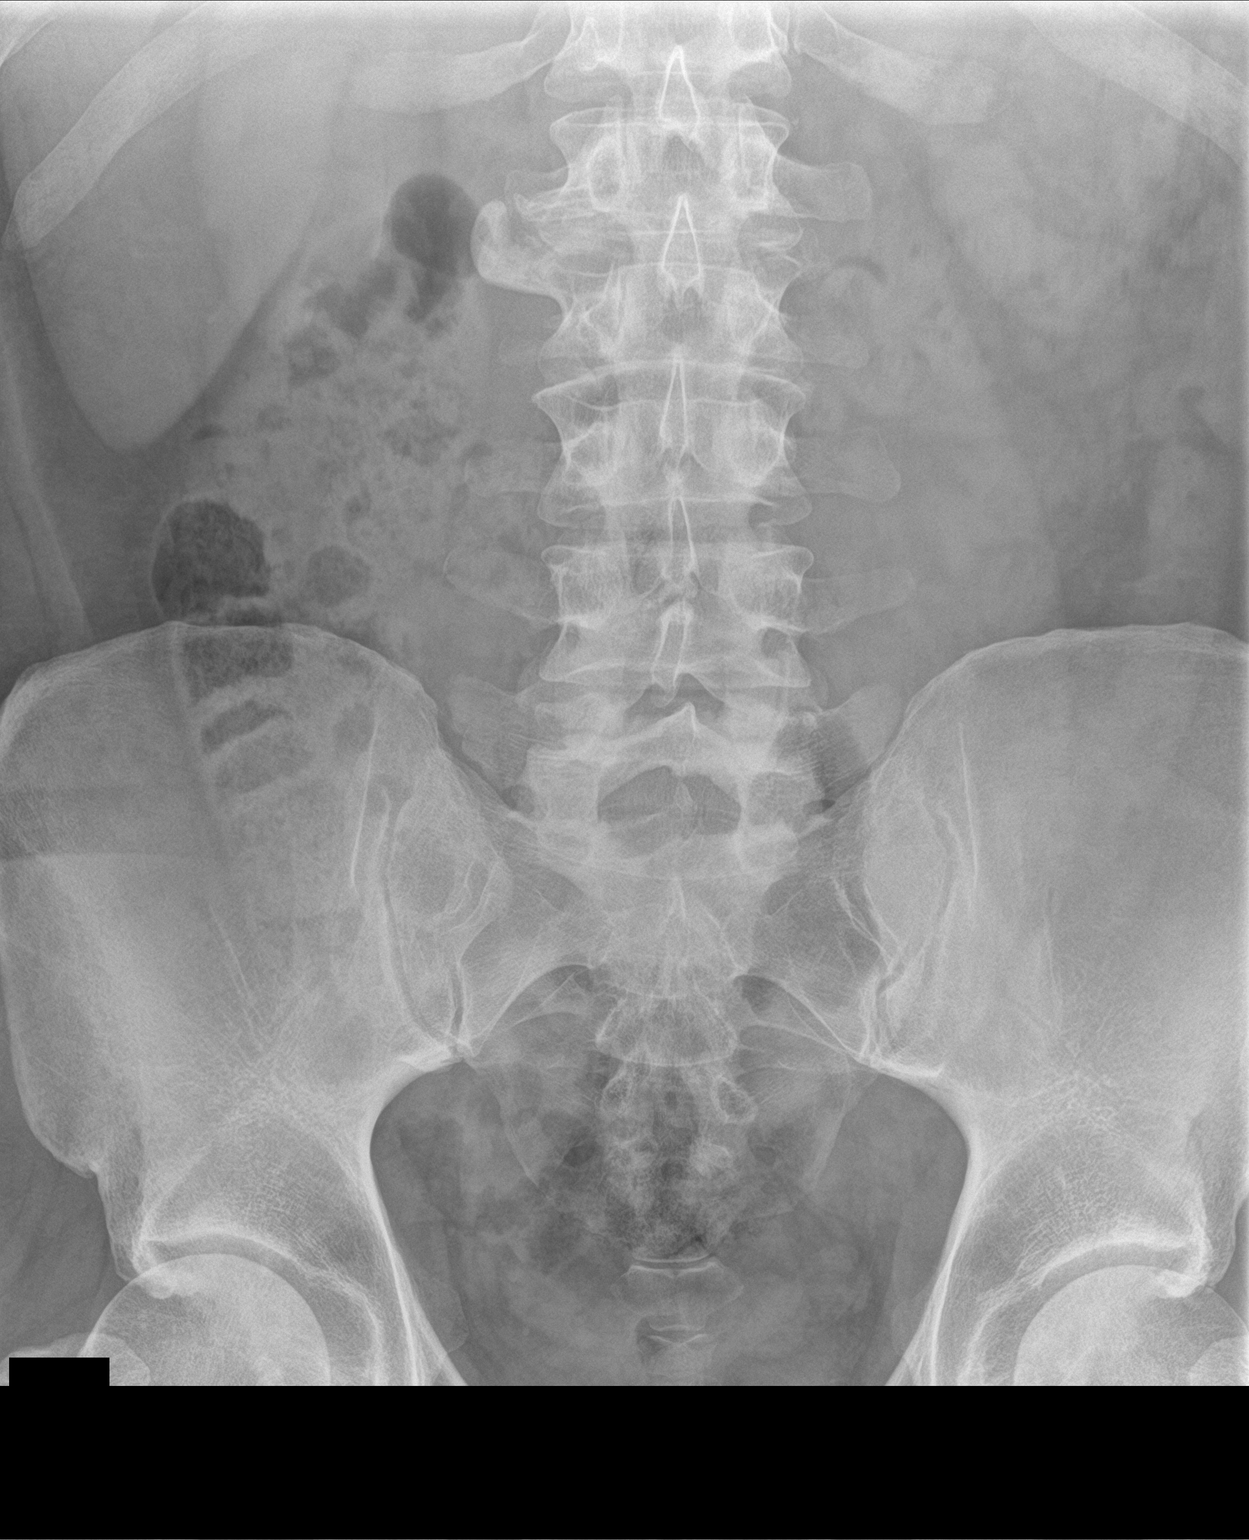
[im 2/2]
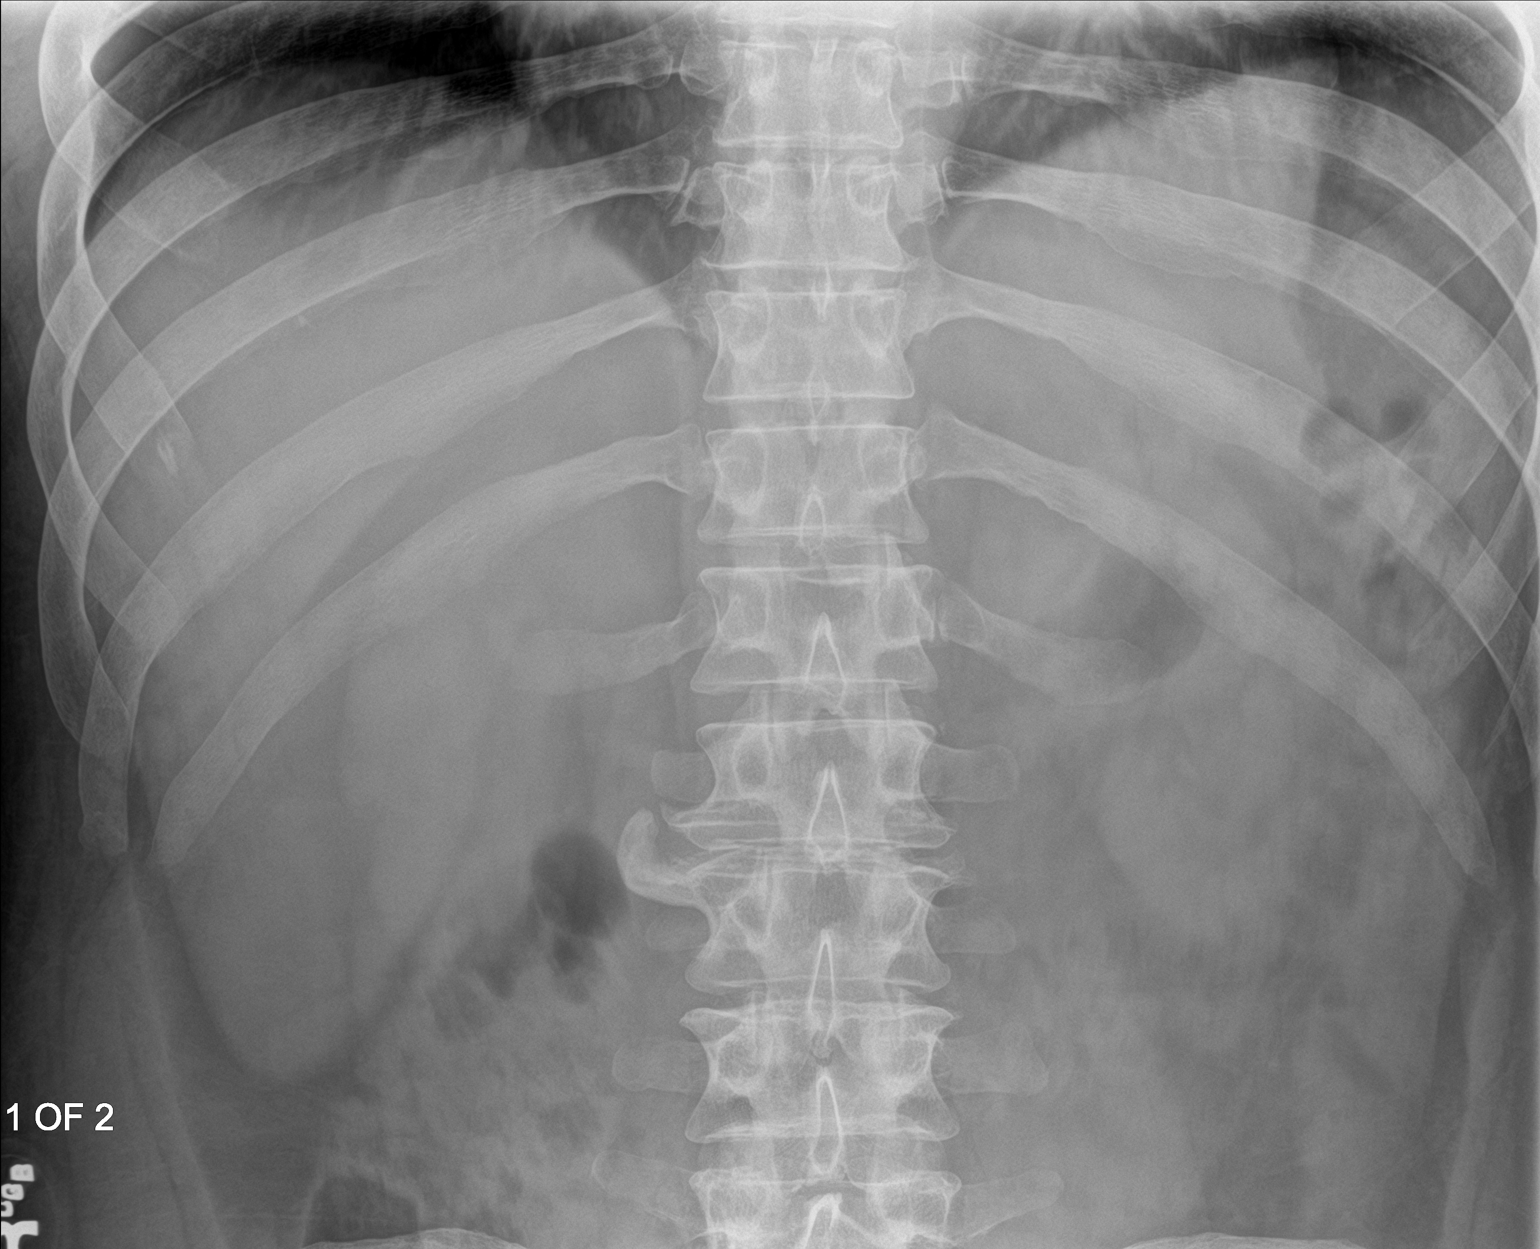

[2 of 2 positions shown; findings below may reference images not displayed]

FINDINGS: The bowel gas pattern is normal. Scattered stool in the colon. No
radio-opaque calculi or other significant radiographic abnormality
are seen.
IMPRESSION: Nonobstructive pattern of bowel gas. No large burden of stool in the
colon.

## 2019-10-26 NOTE — ED Triage Notes (Signed)
abd pain x 3 days. Family had a stomach bug that went thru everyone last week, which he had, but now feels constipated.

## 2019-10-26 NOTE — Discharge Instructions (Addendum)
Your exam, labs, and XR are normal at this time. Continue to monitor symptoms. Increase your fiber intake and consider OTC stool softener. Follow-up with  as planned. Return as needed.

## 2019-10-26 NOTE — ED Provider Notes (Signed)
[facility] Emergency Department Provider Note ____________________________________________  Time seen: 18  I have reviewed the triage vital signs and the nursing notes.  HISTORY  Chief Complaint  Abdominal Pain  HPI Melvin Hill is a 39 y.o. male presents himself to the ED for evaluation of  3 days of generalized lower abdominal pain.  Patient describes similar symptoms including nausea, vomiting, and diarrhea in his household contacts as well as his extended family members.  He reports continued feelings of bloating as well as flatulence.  Denies any ongoing nausea or vomiting.  Patient also denies any anorexia.  He is not take any medications in the interim for symptom relief denies any interim fevers, chills, sweats, chest pain, or reflux.  Past Medical History:  Diagnosis Date  . History of chicken pox   . Obesity     Patient Active Problem List   Diagnosis Date Noted  . Viral illness 06/01/2015  . Elbow pain 10/01/2014  . Hand pain 10/01/2014  . Finger mass, left 10/01/2014  . Transaminitis 05/16/2011  . Lower back pain 05/03/2011  . Obesity     Past Surgical History:  Procedure Laterality Date  . ANTERIOR CRUCIATE LIGAMENT REPAIR  2001   Left knee    Prior to Admission medications   Not on File    Allergies Patient has no known allergies.  Family History  Problem Relation Age of Onset  . Diabetes Father        borderline  . Diabetes Maternal Grandfather   . Stroke Maternal Grandfather   . Diabetes Maternal Grandmother   . Coronary artery disease Maternal Grandmother   . Cancer Neg Hx   . Hypertension Neg Hx     Social History Social History   Tobacco Use  . Smoking status: Never Smoker  . Smokeless tobacco: Never Used  Substance Use Topics  . Alcohol use: Yes    Comment: Very rare  . Drug use: No    Review of Systems  Constitutional: Negative for fever. Eyes: Negative for visual changes. ENT: Negative for sore  throat. Cardiovascular: Negative for chest pain. Respiratory: Negative for shortness of breath. Gastrointestinal: Positive for lower abdominal pain and bloating.  Denies any vomiting and diarrhea. Genitourinary: Negative for dysuria. Musculoskeletal: Negative for back pain. Skin: Negative for rash. Neurological: Negative for headaches, focal weakness or numbness. ____________________________________________  PHYSICAL EXAM:  VITAL SIGNS: ED Triage Vitals  Enc Vitals Group     BP 10/26/19 1627 136/77     Pulse Rate 10/26/19 1627 65     Resp 10/26/19 1627 20     Temp 10/26/19 1627 98.1 F (36.7 C)     Temp Source 10/26/19 1627 Oral     SpO2 10/26/19 1627 100 %     Weight 10/26/19 1629 (!) 315 lb (142.9 kg)     Height 10/26/19 1629 6\' 4"  (1.93 m)     Head Circumference --      Peak Flow --      Pain Score --      Pain Loc --      Pain Edu? --      Excl. in [place]? --     Constitutional: Alert and oriented. Well appearing and in no distress. Head: Normocephalic and atraumatic. Eyes: Conjunctivae are normal. Normal extraocular movements Cardiovascular: Normal rate, regular rhythm. Normal distal pulses. Respiratory: Normal respiratory effort. No wheezes/rales/rhonchi. Gastrointestinal: Soft and nontender. No distention, rebound, guarding, or rigidity.  Normal bowel sounds noted. Musculoskeletal: Nontender  with normal range of motion in all extremities.  Neurologic:  Normal gait without ataxia. Normal speech and language. No gross focal neurologic deficits are appreciated. Skin:  Skin is warm, dry and intact. No rash noted. Psychiatric: Mood and affect are normal. Patient exhibits appropriate insight and judgment. ____________________________________________   LABS (pertinent positives/negatives) Labs Reviewed  URINALYSIS, COMPLETE (UACMP) WITH MICROSCOPIC - Abnormal; Notable for the following components:      Result Value   Color, Urine YELLOW (*)    APPearance CLEAR (*)     Ketones, ur 5 (*)    All other components within normal limits  COMPREHENSIVE METABOLIC PANEL - Abnormal; Notable for the following components:   Glucose, Bld 101 (*)    All other components within normal limits  CBC WITH DIFFERENTIAL/PLATELET  LIPASE, BLOOD  ____________________________________________   RADIOLOGY  DG ABD 1 V IMPRESSION: Nonobstructive pattern of bowel gas. No large burden of stool in the colon. ____________________________________________  PROCEDURES  Procedures ____________________________________________  INITIAL IMPRESSION / ASSESSMENT AND PLAN / ED COURSE  Differential diagnosis includes, but is not limited to, acute appendicitis, renal colic, testicular torsion, urinary tract infection/pyelonephritis, prostatitis,  epididymitis, diverticulitis, small bowel obstruction or ileus, colitis, abdominal aortic aneurysm, gastroenteritis, hernia, etc.  Patient with ED evaluation of 3 to 4-day complaint of intermittent bloating and lower abdominal pain.  Patient's labs overall reassuring at this time with no signs of acute abdominal process.  Abdominal x-ray did not reveal any obstructive gas pattern or impaction.  Patient reports overall improvement of his symptoms, and also notes reassurance following his evaluation.  He will follow-up with primary provider as planned or return to the ED as necessary.  He is given instruction on high-fiber diet as well as over-the-counter stool softeners.  Melvin Hill was evaluated in Emergency Department on 10/27/2019 for the symptoms described in the history of present illness. He was evaluated in the context of the global COVID-19 pandemic, which necessitated consideration that the patient might be at risk for infection with the SARS-CoV-2 virus that causes COVID-19. Institutional protocols and algorithms that pertain to the evaluation of patients at risk for COVID-19 are in a state of rapid change based on information released by  regulatory bodies including the CDC and federal and state organizations. These policies and algorithms were followed during the patient's care in the ED. ____________________________________________  FINAL CLINICAL IMPRESSION(S) / ED DIAGNOSES  Final diagnoses:  Generalized abdominal pain  Constipation, unspecified constipation type      Lissa Hoard, PA-C 10/27/19 0106    Phineas Semen, MD 10/27/19 [phone]

## 2019-11-18 ENCOUNTER — Ambulatory Visit: Payer: 59 | Admitting: Family Medicine

## 2019-11-18 ENCOUNTER — Other Ambulatory Visit: Payer: Self-pay

## 2019-11-18 ENCOUNTER — Encounter: Payer: Self-pay | Admitting: Family Medicine

## 2019-11-18 VITALS — BP 120/64 | HR 69 | Temp 97.8°F | Ht 76.0 in | Wt 311.0 lb

## 2019-11-18 DIAGNOSIS — M25551 Pain in right hip: Secondary | ICD-10-CM | POA: Diagnosis not present

## 2019-11-18 DIAGNOSIS — Z7689 Persons encountering health services in other specified circumstances: Secondary | ICD-10-CM

## 2019-11-18 DIAGNOSIS — M545 Low back pain, unspecified: Secondary | ICD-10-CM

## 2019-11-18 DIAGNOSIS — Z6837 Body mass index (BMI) 37.0-37.9, adult: Secondary | ICD-10-CM

## 2019-11-18 DIAGNOSIS — E6609 Other obesity due to excess calories: Secondary | ICD-10-CM

## 2019-11-18 NOTE — Progress Notes (Signed)
Subjective:    Patient ID: Melvin Hill, male    DOB: [birth year], 39 y.o.   MRN: [id number]  HPI Chief Complaint  Patient presents with  . New Patient (Initial Visit)   This is a 39 yo male who presents today to establish care. 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 Enjoys playing golf. Landscapes on the side.   Last CPE- several years Tdap- unsure Flu- some years Dental- regular Exercise- enjoys running  He was seen in ER earlier this month. Had several days of constipation prior to admission. No more constipation or abdominal pain.   Prior to ER visit, was concerned about blood sugar, had hgba1c at [facility]. Reports that it was within normal range.   Back pain x 2 weeks. Has had similar pain in past. 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 Pain got worse, felt tight. Worse after sitting. Shooting pain along left side, down upper leg. Went to chiropractor for adjustment with temporary relief. Takes ibuprofen 800 mg up to 2x/ day. Not sure if it helps. Today did planting and mulching.   Review of Systems No headaches, no visual changes, no chest pain, no dysuria    Objective:   Physical Exam Vitals reviewed.  Constitutional:      General: He is not in acute distress.    Appearance: Normal appearance. He is obese. He is not ill-appearing, toxic-appearing or diaphoretic.  HENT:     Head: Normocephalic and atraumatic.  Eyes:     Conjunctiva/sclera: Conjunctivae normal.  Cardiovascular:     Rate and Rhythm: Normal rate and regular rhythm.     Heart sounds: Normal heart sounds.  Pulmonary:     Effort: Pulmonary effort is normal.     Breath sounds: Normal breath sounds.  Musculoskeletal:     Cervical back: Normal, normal range of motion and neck supple.     Thoracic back: Normal.     Lumbar back: Tenderness (right paraspinal) present.     Right lower leg: No edema.     Left lower leg: No edema.     Comments: Normal gait.  Right hip with decreased internal rotation. Bilateral legs with tight hamstrings.   Skin:    General: Skin is warm and dry.  Neurological:     Mental Status: He is alert and oriented to person, place, and time.  Psychiatric:        Mood and Affect: Mood normal.        Behavior: Behavior normal.        Thought Content: Thought content normal.        Judgment: Judgment normal.       Temp 97.8 F (36.6 C) (Temporal)   Wt (!) 311 lb (141.1 kg)   BMI 37.86 kg/m  Wt Readings from Last 3 Encounters:  11/18/19 (!) 311 lb (141.1 kg)  10/26/19 (!) 315 lb (142.9 kg)  08/09/16 (!) 334 lb (151.5 kg)        Assessment & Plan:  1. Encounter to establish care - reviewed available records on EMR  2. Right hip pain - no worrisome findings on history/ PE, will benefit from PT for decreased ROM - Ambulatory referral to Physical Therapy  3. Acute right-sided low back pain without sciatica - otc analgesics prn, heat - Ambulatory referral to Physical Therapy  4. Class 2 obesity due to excess calories without serious comorbidity with body mass index (BMI) of 37.0 to 37.9  in adult - has been losing weight, encouraged him to make healthy food choices, continue regular exercise  - follow up in 6 months for cpe  This visit occurred during the SARS-CoV-2 public health emergency.  Safety protocols were in place, including screening questions prior to the visit, additional usage of staff PPE, and extensive cleaning of exam room while observing appropriate contact time as indicated for disinfecting solutions.    Clarene Reamer, FNP-BC  [facility]  11/18/2019 5:31 PM

## 2019-11-18 NOTE — Patient Instructions (Signed)
Good to see you today    Back Exercises These exercises help to make your trunk and back strong. They also help to keep the lower back flexible. Doing these exercises can help to prevent back pain or lessen existing pain.  If you have back pain, try to do these exercises 2-3 times each day or as told by your doctor.  As you get better, do the exercises once each day. Repeat the exercises more often as told by your doctor.  To stop back pain from coming back, do the exercises once each day, or as told by your doctor. Exercises Single knee to chest Do these steps 3-5 times in a row for each leg: 1. Lie on your back on a firm bed or the floor with your legs stretched out. 2. Bring one knee to your chest. 3. Grab your knee or thigh with both hands and hold them it in place. 4. Pull on your knee until you feel a gentle stretch in your lower back or buttocks. 5. Keep doing the stretch for 10-30 seconds. 6. Slowly let go of your leg and straighten it. Pelvic tilt Do these steps 5-10 times in a row: 1. Lie on your back on a firm bed or the floor with your legs stretched out. 2. Bend your knees so they point up to the ceiling. Your feet should be flat on the floor. 3. Tighten your lower belly (abdomen) muscles to press your lower back against the floor. This will make your tailbone point up to the ceiling instead of pointing down to your feet or the floor. 4. Stay in this position for 5-10 seconds while you gently tighten your muscles and breathe evenly. Cat-cow Do these steps until your lower back bends more easily: 1. Get on your hands and knees on a firm surface. Keep your hands under your shoulders, and keep your knees under your hips. You may put padding under your knees. 2. Let your head hang down toward your chest. Tighten (contract) the muscles in your belly. Point your tailbone toward the floor so your lower back becomes rounded like the back of a cat. 3. Stay in this position for 5  seconds. 4. Slowly lift your head. Let the muscles of your belly relax. Point your tailbone up toward the ceiling so your back forms a sagging arch like the back of a cow. 5. Stay in this position for 5 seconds.  Press-ups Do these steps 5-10 times in a row: 1. Lie on your belly (face-down) on the floor. 2. Place your hands near your head, about shoulder-width apart. 3. While you keep your back relaxed and keep your hips on the floor, slowly straighten your arms to raise the top half of your body and lift your shoulders. Do not use your back muscles. You may change where you place your hands in order to make yourself more comfortable. 4. Stay in this position for 5 seconds. 5. Slowly return to lying flat on the floor.  Bridges Do these steps 10 times in a row: 1. Lie on your back on a firm surface. 2. Bend your knees so they point up to the ceiling. Your feet should be flat on the floor. Your arms should be flat at your sides, next to your body. 3. Tighten your butt muscles and lift your butt off the floor until your waist is almost as high as your knees. If you do not feel the muscles working in your butt and the back  of your thighs, slide your feet 1-2 inches farther away from your butt. 4. Stay in this position for 3-5 seconds. 5. Slowly lower your butt to the floor, and let your butt muscles relax. If this exercise is too easy, try doing it with your arms crossed over your chest. Belly crunches Do these steps 5-10 times in a row: 1. Lie on your back on a firm bed or the floor with your legs stretched out. 2. Bend your knees so they point up to the ceiling. Your feet should be flat on the floor. 3. Cross your arms over your chest. 4. Tip your chin a little bit toward your chest but do not bend your neck. 5. Tighten your belly muscles and slowly raise your chest just enough to lift your shoulder blades a tiny bit off of the floor. Avoid raising your body higher than that, because it can  put too much stress on your low back. 6. Slowly lower your chest and your head to the floor. Back lifts Do these steps 5-10 times in a row: 1. Lie on your belly (face-down) with your arms at your sides, and rest your forehead on the floor. 2. Tighten the muscles in your legs and your butt. 3. Slowly lift your chest off of the floor while you keep your hips on the floor. Keep the back of your head in line with the curve in your back. Look at the floor while you do this. 4. Stay in this position for 3-5 seconds. 5. Slowly lower your chest and your face to the floor. Contact a doctor if:  Your back pain gets a lot worse when you do an exercise.  Your back pain does not get better 2 hours after you exercise. If you have any of these problems, stop doing the exercises. Do not do them again unless your doctor says it is okay. Get help right away if:  You have sudden, very bad back pain. If this happens, stop doing the exercises. Do not do them again unless your doctor says it is okay. This information is not intended to replace advice given to you by your health care provider. Make sure you discuss any questions you have with your health care provider. Document Revised: 04/05/2018 Document Reviewed: 04/05/2018 Elsevier Patient Education  2020 Elsevier Inc.   Piriformis Syndrome Rehab Ask your health care provider which exercises are safe for you. Do exercises exactly as told by your health care provider and adjust them as directed. It is normal to feel mild stretching, pulling, tightness, or discomfort as you do these exercises. Stop right away if you feel sudden pain or your pain gets worse. Do not begin these exercises until told by your health care provider. Stretching and range-of-motion exercises These exercises warm up your muscles and joints and improve the movement and flexibility of your hip and pelvis. The exercises also help to relieve pain, numbness, and tingling. Hip rotation This  is an exercise in which you lie on your back and stretch the muscles that rotate your hip (hip rotators) to stretch your buttocks. 1. Lie on your back on a firm surface. 2. Pull your left / right knee toward your same shoulder with your left / right hand until your knee is pointing toward the ceiling. Hold your left / right ankle with your other hand. 3. Keeping your knee steady, gently pull your left / right ankle toward your other shoulder until you feel a stretch in your buttocks. 4.  Hold this position for __________ seconds. Repeat __________ times. Complete this exercise __________ times a day. Hip extensor This is an exercise in which you lie on your back and pull your knee to your chest. 1. Lie on your back on a firm surface. Both of your legs should be straight. 2. Pull your left / right knee to your chest. Hold your leg in this position by holding onto the back of your thigh or the front of your knee. 3. Hold this position for __________ seconds. 4. Slowly return to the starting position. Repeat __________ times. Complete this exercise __________ times a day. Strengthening exercises These exercises build strength and endurance in your hip and thigh muscles. Endurance is the ability to use your muscles for a long time, even after they get tired. Straight leg raises, side-lying This exercise strengthens the muscles that rotate the leg at the hip and move it away from your body (hip abductors). 1. Lie on your side with your left / right leg in the top position. Lie so your head, shoulder, knee, and hip line up. Bend your bottom knee to help you balance. 2. Lift your top leg 4-6 inches (10-15 cm) while keeping your toes pointed straight ahead. 3. Hold this position for __________ seconds. 4. Slowly lower your leg to the starting position. 5. Let your muscles relax completely after each repetition. Repeat __________ times. Complete this exercise __________ times a day. Hip abduction and  rotation This is sometimes called quadruped (on hands and knees) exercises. 1. Get on your hands and knees on a firm, lightly padded surface. Your hands should be directly below your shoulders, and your knees should be directly below your hips. 2. Lift your left / right knee out to the side. Keep your knee bent. Do not twist your body. 3. Hold this position for __________ seconds. 4. Slowly lower your leg. Repeat __________ times. Complete this exercise __________ times a day. Straight leg raises, face-down This exercise stretches the muscles that move your hips away from the front of the pelvis (hip extensors). 1. Lie on your abdomen on a bed or a firm surface with a pillow under your hips. 2. Squeeze your buttocks muscles and lift your left / right leg about 4-6 inches (10-15 cm) off the bed. Do not let your back arch. 3. Hold this position for __________ seconds. 4. Slowly lower your leg to the starting position. 5. Let your muscles relax completely after each repetition. Repeat __________ times. Complete this exercise __________ times a day. This information is not intended to replace advice given to you by your health care provider. Make sure you discuss any questions you have with your health care provider. Document Revised: 11/01/2018 Document Reviewed: 05/03/2018 Elsevier Patient Education  [place].

## 2019-12-20 ENCOUNTER — Telehealth: Payer: Self-pay | Admitting: Family Medicine

## 2019-12-20 NOTE — Telephone Encounter (Signed)
Patient called today requesting a new referral He stated that he was referred to start physical therapy. The office he was referred to he is not satisfied with , he stated it was like they were in a rush and did not spend a lot of time seeing him  Patient would like a new referral to [facility], [place]ette Or

## 2019-12-25 ENCOUNTER — Other Ambulatory Visit: Payer: Self-pay | Admitting: Family Medicine

## 2019-12-25 DIAGNOSIS — M25551 Pain in right hip: Secondary | ICD-10-CM

## 2019-12-25 DIAGNOSIS — M545 Low back pain, unspecified: Secondary | ICD-10-CM

## 2019-12-25 NOTE — Telephone Encounter (Signed)
Patient called about referral.  Patient said his back is very tight and it's waking him up at night. Patient said he was given a fax number to send the referral to [phone].

## 2019-12-25 NOTE — Telephone Encounter (Signed)
Please call patient and let him know that new referral has been placed.

## 2019-12-25 NOTE — Telephone Encounter (Signed)
Pt is aware that referral has been placed.   Nothing further needed.

## 2020-02-25 ENCOUNTER — Encounter: Payer: Self-pay | Admitting: Family Medicine

## 2020-02-25 ENCOUNTER — Ambulatory Visit: Payer: 59 | Admitting: Family Medicine

## 2020-02-25 ENCOUNTER — Other Ambulatory Visit: Payer: Self-pay

## 2020-02-25 VITALS — BP 118/70 | HR 80 | Temp 97.4°F | Ht 76.0 in | Wt 315.5 lb

## 2020-02-25 DIAGNOSIS — R12 Heartburn: Secondary | ICD-10-CM | POA: Insufficient documentation

## 2020-02-25 DIAGNOSIS — R0789 Other chest pain: Secondary | ICD-10-CM | POA: Diagnosis not present

## 2020-02-25 NOTE — Patient Instructions (Addendum)
Try to eat better  Avoid heavy/greasy foods and spicy things and caffeine and carbonation  Avoid carbonation unless you need to burp  Acidic drinks like lemonade and OJ- avoid as well Also avoid ibuprofen or naproxen or aspirin (nsaids) over the counter  Continue the omeprazole daily for at least 2 weeks  tums occasionally is ok  Don't eat right before bed   Most likely -the soreness is more muscular   Let's do an EKG also   Follow up with Debbie in approx 2 weeks   If your symptoms worsen or change please let us know

## 2020-02-25 NOTE — Assessment & Plan Note (Signed)
Suspect related to his heartburn/reflux symptoms  Not exertional  Some soreness in upper back muscles as well  EKG shows RBBB today (no baseline to compare)  No acute changes however  Recommend d/w pcp at f/u appt In meantime will update if symptoms worsen

## 2020-02-25 NOTE — Progress Notes (Signed)
Subjective:    Patient ID: Melvin Hill, male    DOB: 05-17-1981, 39 y.o.   MRN: [id number]  This visit occurred during the SARS-CoV-2 public health emergency.  Safety protocols were in place, including screening questions prior to the visit, additional usage of staff PPE, and extensive cleaning of exam room while observing appropriate contact time as indicated for disinfecting solutions.    HPI 39 yo pt of NP Leone Payor presents with symptoms of heartburn and upper back pain  Wt Readings from Last 3 Encounters:  02/25/20 (!) 315 lb 8 oz (143.1 kg)  11/18/19 (!) 311 lb (141.1 kg)  10/26/19 (!) 315 lb (142.9 kg)   38.40 kg/m    Symptoms started 2 weeks ago gradually   Feels like heartburn  Burning sensation in mid chest and into throat  occ across top of chest on both sides  Worse after eating (heavy food)  Sometimes feels like gas in his chest  Tries to make himself burp -a big burp will help tums - does help   Started prilosec 3 days ago (20 mg once daily) Helping a bit  Eating better also   Today some improvement -also has not eaten in quite a while   Does not taste acid in mouth   When he gets stressed- aware of heart beat- not fast or irregular Better if he is busy   One day woke up with sore neck/shoulders and upper back  Unsure if related  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  cxr nl in 2011   BP Readings from Last 3 Encounters:  02/25/20 118/70  11/18/19 120/64  10/26/19 140/82   Pulse Readings from Last 3 Encounters:  02/25/20 80  11/18/19 69  10/26/19 66   Does not work out on a regular basis  Light wt lifting and likes to run on treadmill   EKG shows sinus bradycardia with RBBB No baseline for comparison  Patient Active  Problem List   Diagnosis Date Noted  . Heartburn 02/25/2020  . Atypical chest pain 02/25/2020  . Viral illness 06/01/2015  . Elbow pain 10/01/2014  . Hand pain 10/01/2014  . Finger mass, left 10/01/2014  . Transaminitis 05/16/2011  . Lower back pain 05/03/2011  . Class 2 obesity due to excess calories without serious comorbidity with body mass index (BMI) of 37.0 to 37.9 in adult    Past Medical History:  Diagnosis Date  . History of chicken pox   . Obesity    Past Surgical History:  Procedure Laterality Date  . ANTERIOR CRUCIATE LIGAMENT REPAIR  2001   Left knee   Social History   Tobacco Use  . Smoking status: Never Smoker  . Smokeless tobacco: Never Used  Substance Use Topics  . Alcohol use: Yes    Comment: Very rare  . Drug use: No   Family History  Problem Relation Age of Onset  . Diabetes Father   . Diabetes Maternal Grandfather   . Stroke Maternal Grandfather   . Diabetes Maternal Grandmother   . Coronary artery disease Maternal Grandmother   . Cancer Neg Hx   . Hypertension Neg Hx    No Known Allergies Current Outpatient Medications on File Prior to  Visit  Medication Sig Dispense Refill  . calcium carbonate (TUMS EX) 750 MG chewable tablet Chew 1 tablet by mouth as needed for heartburn.    Marland Kitchen omeprazole (PRILOSEC) 20 MG capsule Take 20 mg by mouth daily.     No current facility-administered medications on file prior to visit.    Review of Systems  Constitutional: Negative for activity change, appetite change, fatigue, fever and unexpected weight change.  HENT: Negative for congestion, rhinorrhea, sore throat and trouble swallowing.   Eyes: Negative for pain, redness, itching and visual disturbance.  Respiratory: Negative for cough, chest tightness, shortness of breath and wheezing.   Cardiovascular: Negative for chest pain and palpitations.       More aware of heartbeat when he is anxious  Gastrointestinal: Negative for abdominal pain, blood in stool,  constipation, diarrhea and nausea.       Heartburn relieved by belching   No regurgitation or dysphagia   Endocrine: Negative for cold intolerance, heat intolerance, polydipsia and polyuria.  Genitourinary: Negative for difficulty urinating, dysuria, frequency and urgency.  Musculoskeletal: Negative for arthralgias, joint swelling and myalgias.  Skin: Negative for pallor and rash.  Neurological: Negative for dizziness, tremors, weakness, numbness and headaches.  Hematological: Negative for adenopathy. Does not bruise/bleed easily.  Psychiatric/Behavioral: Negative for decreased concentration and dysphoric mood. The patient is nervous/anxious.        Objective:   Physical Exam Constitutional:      General: He is not in acute distress.    Appearance: Normal appearance. He is well-developed. He is obese.  HENT:     Head: Normocephalic and atraumatic.     Mouth/Throat:     Mouth: Mucous membranes are moist.  Eyes:     General: No scleral icterus.    Conjunctiva/sclera: Conjunctivae normal.     Pupils: Pupils are equal, round, and reactive to light.  Cardiovascular:     Rate and Rhythm: Normal rate and regular rhythm.     Pulses: Normal pulses.     Heart sounds: Normal heart sounds. No murmur heard.   Pulmonary:     Effort: Pulmonary effort is normal. No respiratory distress.     Breath sounds: Normal breath sounds. No wheezing or rales.  Abdominal:     General: Bowel sounds are normal. There is no distension or abdominal bruit.     Palpations: Abdomen is soft. There is no hepatomegaly, splenomegaly, mass or pulsatile mass.     Tenderness: There is abdominal tenderness in the epigastric area. There is no guarding or rebound. Negative signs include Murphy's sign.     Comments: Mild epigastric tenderness   Musculoskeletal:        General: No tenderness.     Cervical back: Normal range of motion and neck supple.     Right lower leg: No edema.     Left lower leg: No edema.    Lymphadenopathy:     Cervical: No cervical adenopathy.  Skin:    General: Skin is warm and dry.     Coloration: Skin is not pale.     Findings: No erythema.  Neurological:     Mental Status: He is alert.     Coordination: Coordination normal.     Deep Tendon Reflexes: Reflexes normal.  Psychiatric:        Mood and Affect: Mood is anxious.        Cognition and Memory: Cognition and memory normal.           Assessment & Plan:  Problem List Items Addressed This Visit      Other   Heartburn - Primary    A little improvement in past 2 d with improved diet and omeprazole 20 mg daily  Disc lifestyle changes to help GERD Recommend wt loss as well  Handouts given  Recommend he continue the ppi daily for at least 2 wk Plan f/u with pcp in 2 wk  inst to call if symptoms worsen      Atypical chest pain    Suspect related to his heartburn/reflux symptoms  Not exertional  Some soreness in upper back muscles as well  EKG shows RBBB today (no baseline to compare)  No acute changes however  Recommend d/w pcp at f/u appt In meantime will update if symptoms worsen      Relevant Orders   EKG 12-Lead (Completed)

## 2020-02-25 NOTE — Assessment & Plan Note (Signed)
A little improvement in past 2 d with improved diet and omeprazole 20 mg daily  Disc lifestyle changes to help GERD Recommend wt loss as well  Handouts given  Recommend he continue the ppi daily for at least 2 wk Plan f/u with pcp in 2 wk  inst to call if symptoms worsen

## 2020-02-26 ENCOUNTER — Telehealth: Payer: Self-pay | Admitting: *Deleted

## 2020-02-26 ENCOUNTER — Encounter: Payer: Self-pay | Admitting: Family Medicine

## 2020-02-26 NOTE — Telephone Encounter (Signed)
Returning call from CMA. Please advise.  

## 2020-02-26 NOTE — Telephone Encounter (Signed)
Addressed with pt and noted

## 2020-02-26 NOTE — Telephone Encounter (Signed)
See question regarding pt's EKG

## 2020-02-26 NOTE — Telephone Encounter (Signed)
Left VM requesting pt to call the office back regarding EKG

## 2020-03-09 ENCOUNTER — Encounter: Payer: Self-pay | Admitting: Family Medicine

## 2020-03-09 ENCOUNTER — Other Ambulatory Visit: Payer: Self-pay

## 2020-03-09 ENCOUNTER — Ambulatory Visit: Payer: 59 | Admitting: Family Medicine

## 2020-03-09 VITALS — BP 134/82 | HR 62 | Temp 96.1°F | Ht 76.0 in | Wt 320.8 lb

## 2020-03-09 DIAGNOSIS — M542 Cervicalgia: Secondary | ICD-10-CM

## 2020-03-09 DIAGNOSIS — K219 Gastro-esophageal reflux disease without esophagitis: Secondary | ICD-10-CM | POA: Insufficient documentation

## 2020-03-09 DIAGNOSIS — Z6839 Body mass index (BMI) 39.0-39.9, adult: Secondary | ICD-10-CM

## 2020-03-09 DIAGNOSIS — I4510 Unspecified right bundle-branch block: Secondary | ICD-10-CM | POA: Insufficient documentation

## 2020-03-09 DIAGNOSIS — E6609 Other obesity due to excess calories: Secondary | ICD-10-CM | POA: Diagnosis not present

## 2020-03-09 DIAGNOSIS — R9431 Abnormal electrocardiogram [ECG] [EKG]: Secondary | ICD-10-CM | POA: Diagnosis not present

## 2020-03-09 NOTE — Progress Notes (Signed)
   Subjective:    Patient ID: Melvin Hill, male    DOB: 1981/05/30, 39 y.o.   MRN: [id number]  HPI Chief Complaint  Patient presents with  . Follow-up   This is a 39 yo male who presents today for follow up of recent visit with Dr. Milinda Antis. He was seen 02/25/2020 with symptoms of heartburn, burning in chest and throat. Had started omeprazole a couple of days prior to visit with some relief. EKG was done which showed RBBB.  He finished omeprazole three days ago (did 14 day course). Didn't have symptoms for 4-5 days. Had some symptoms over last 2 days, not as careful about his diet. Triggers include fried foods, starches. Some soft bowel movements, no blood. Feels heart beat when lying down. Has been very active, running- no chest pain or SOB.   Some tightness in middle of back, radiates to front of shoulders. Neck tight this am, left trapezius. Thinks he slept on it wrong.   Some increased stress. Has an upcoming promotiol test.   Obesity- eats 3 meals and several snacks a day. Has been increasing his salad intake.    Review of Systems    per HPI Objective:   Physical Exam Physical Exam  Constitutional: Oriented to person, place, and time. Appears well-developed and well-nourished.  HENT:  Head: Normocephalic and atraumatic.  Eyes: Conjunctivae are normal.  Neck: Normal range of motion. Neck supple.  Cardiovascular: Normal rate, regular rhythm and normal heart sounds.   Pulmonary/Chest: Effort normal and breath sounds normal.  Abdomen: no tenderness Musculoskeletal: No lower extremity edema.  Neck with full ROM, slight tenderness left trapezius.  Neurological: Alert and oriented to person, place, and time.  Skin: Skin is warm and dry.  Psychiatric: Normal mood and affect. Behavior is normal. Judgment and thought content normal.  Vitals reviewed.     BP 134/82   Pulse 62   Temp (!) 96.1 F (35.6 C) (Temporal)   Ht 6\' 4"  (1.93 m)   Wt (!) 320 lb 12 oz (145.5 kg)   SpO2 98%    BMI 39.04 kg/m  Wt Readings from Last 3 Encounters:  03/09/20 (!) 320 lb 12 oz (145.5 kg)  02/25/20 (!) 315 lb 8 oz (143.1 kg)  11/18/19 (!) 311 lb (141.1 kg)   EKG- personally reviewed, sinus brady, rate 54 bpm, PR 214, QT 430, right BBB. No change from tracing 02/25/2020.      Assessment & Plan:  1. Abnormal EKG - patient concerned about RBBB found on previous EKG, thinks might be related to dehydration that day - reviewed EKG findings with patient, reassured - EKG 12-Lead  2. Gastroesophageal reflux disease, unspecified whether esophagitis present - discussed avoiding triggers, weight loss,   3. Class 2 obesity due to excess calories without serious comorbidity with body mass index (BMI) of 39.0 to 39.9 in adult - discussed healthy diet, provided written information. Advised him to increase water, decrease number of times a day he is eating and snacking, avoid fast foods, fried foods, processed foods.  - follow up in 6 months  4. Neck pain on left side - no worrisome findings on exam, stretch/ heat recommended   04/26/2020, FNP-BC  [facility], [facility]  03/09/2020 8:28 AM

## 2020-03-09 NOTE — Patient Instructions (Signed)
Good to see you today  Please try over the counter Pepcid (generic ok- famotidine) as needed for acid indigestion. Can take up to two times daily.   Avoid triggers. Work on weight loss.   There is not one right eating plan for everyone.  It may take trial and error to find what will work for you.  It is important to get adequate protein and fiber with your meals.  It is okay to not eat breakfast or to skip meals if you are not hungry.  Avoid snacking between meals.  Unless you are on a fluid restriction, drink 80 to 90 ounces of water a day.  Suggested resources- www.CityCalculator.nl www.adaptyourlifeacademy.com-there is a quiz to help you determine how many carbohydrates you should eat a day  www.thefastingmethod.com  If you have diabetes or access to a blood sugar machine, I recommend you check your blood sugar daily and keep a log.  Vary the time you check your blood sugar such as fasting, before meal, 2 hours after a meal and at bedtime.  Look for trends with the foods you are eating and be a scientist of your body.  Here are some guidelines to help you with meal planning -  Avoid all processed and packaged foods (bread, pasta, crackers, chips, etc) and beverages containing calories.  Avoid added sugars and excessive natural sugars.  Pay attention to how you feel if you consume artificial sweeteners.  Do they make you more hungry or raise your blood sugar?  With every meal and snack, aim to get 20 g of protein (3 ounces of meat, 4 ounces of fish, 3 eggs, protein powder, 1 cup [place] yogurt, 1 cup cottage cheese, etc.)  Increase fiber in the form of non-starchy vegetables.  These help you feel full with very little carbohydrates and are good for gut health.  Nonstarchy vegetables include summer squash, onions, peppers, tomatoes, eggplant, broccoli, cauliflower, cabbage, lettuce, spinach.  Have small amounts of good fats such as avocado, nuts, olive oil, nut butters, olives.  Add a  little cheese to your meals to make them tasty.   Try to plan your meals for the week and do some meal preparation when able.  If possible, make lunches for the week ahead of time.  Plan a couple of dinners and make enough so you can have leftovers.  Build in a treat once a week.

## 2020-03-16 ENCOUNTER — Encounter: Payer: Self-pay | Admitting: Family Medicine

## 2020-03-29 ENCOUNTER — Encounter: Payer: Self-pay | Admitting: Family Medicine

## 2020-03-31 ENCOUNTER — Other Ambulatory Visit: Payer: Self-pay | Admitting: Family Medicine

## 2020-03-31 DIAGNOSIS — M545 Low back pain, unspecified: Secondary | ICD-10-CM

## 2020-03-31 DIAGNOSIS — M542 Cervicalgia: Secondary | ICD-10-CM

## 2020-04-06 ENCOUNTER — Other Ambulatory Visit: Payer: Self-pay | Admitting: Family Medicine

## 2020-04-06 DIAGNOSIS — R9431 Abnormal electrocardiogram [ECG] [EKG]: Secondary | ICD-10-CM

## 2020-04-06 DIAGNOSIS — R0789 Other chest pain: Secondary | ICD-10-CM

## 2020-04-06 DIAGNOSIS — I4510 Unspecified right bundle-branch block: Secondary | ICD-10-CM

## 2020-04-12 NOTE — Progress Notes (Signed)
Cardiology Office Note  Date:  04/13/2020   ID:  KERMAN PFOST, DOB 09-03-1980, MRN [id number]  PCP:  Emi Belfast, FNP   Chief Complaint  Patient presents with  . OTHER    RBBB/Atypical chest pain. Meds reviewed verbally with pt.    HPI:  Mr. Carmello Delatorre is a 39 year old gentleman with past medical history of GERD Presents by referral from Olean Ree for [employer]  Had GERD,  Better with acid meds Off meds now Very active in the daytime, does landscaping business and farming  concerned about recent finding of right bundle branch block on EKG Made him worried, was also having some atypical chest discomfort which he feels was secondary to musculoskeletal etiology  Visit at home, has 77 and 38 year old  Good habits Non smoker No diabetes  EKG personally reviewed by myself on todays visit Normal sinus rhythm rate 56 bpm no significant ST-T wave changes Right bundle branch pattern less notable compared to prior EKG Prior EKG likely V1/V1' likely normal pattern  Mom and father: No significant premature coronary disease   PMH:   has a past medical history of History of chicken pox and Obesity.  PSH:    Past Surgical History:  Procedure Laterality Date  . ANTERIOR CRUCIATE LIGAMENT REPAIR  2001   Left knee    No current outpatient medications on file.   No current facility-administered medications for this visit.     Allergies:   Patient has no known allergies.   Social History:  The patient  reports that he has never smoked. He has never used smokeless tobacco. He reports current alcohol use. He reports that he does not use drugs.   Family History:   family history includes Coronary artery disease in his maternal grandmother; Diabetes in his father, maternal grandfather, and maternal grandmother; Stroke in his maternal grandfather.    Review of Systems: Review of Systems  Constitutional: Negative.   HENT: Negative.   Respiratory: Negative.    Cardiovascular: Negative.   Gastrointestinal: Negative.   Musculoskeletal: Negative.   Neurological: Negative.   Psychiatric/Behavioral: Negative.   All other systems reviewed and are negative.    PHYSICAL EXAM: VS:  BP 120/90 (BP Location: Right Arm, Patient Position: Sitting, Cuff Size: Large)   Pulse (!) 56   Ht 6\' 4"  (1.93 m)   Wt (!) 318 lb 6 oz (144.4 kg)   SpO2 98%   BMI 38.75 kg/m  , BMI Body mass index is 38.75 kg/m. GEN: Well nourished, well developed, in no acute distress HEENT: normal Neck: no JVD, carotid bruits, or masses Cardiac: RRR; no murmurs, rubs, or gallops,no edema  Respiratory:  clear to auscultation bilaterally, normal work of breathing GI: soft, nontender, nondistended, + BS MS: no deformity or atrophy Skin: warm and dry, no rash Neuro:  Strength and sensation are intact Psych: euthymic mood, full affect   Recent Labs: 10/26/2019: ALT 36; BUN 19; Creatinine, Ser 1.04; Hemoglobin 15.3; Platelets 294; Potassium 4.2; Sodium 139    Lipid Panel No results found for: CHOL, HDL, LDLCALC, TRIG    Wt Readings from Last 3 Encounters:  04/13/20 (!) 318 lb 6 oz (144.4 kg)  03/09/20 (!) 320 lb 12 oz (145.5 kg)  02/25/20 (!) 315 lb 8 oz (143.1 kg)       ASSESSMENT AND PLAN:  Problem List Items Addressed This Visit    Right bundle branch block (RBBB) on electrocardiogram (ECG) - Primary   Class 2 obesity  due to excess calories without serious comorbidity with body mass index (BMI) of 37.0 to 37.9 in adult     Abnormal EKG Essentially benign on today's repeat, no further testing needed Reassurance provided Discussed CT coronary calcium scoring if he would ever like risk stratification, he has deferred at this time which I think is appropriate given his age and lack of risk factors  Atypical chest pain Noncardiac in nature, likely musculoskeletal Very active at baseline with no recurrent symptoms  Obesity We have encouraged continued exercise,  careful diet management in an effort to lose weight.     Total encounter time more than 45 minutes  Greater than 50% was spent in counseling and coordination of care with the patient    Signed, Dossie Arbour, M.D., Ph.D. [facility] [place], [place] [phone]

## 2020-04-13 ENCOUNTER — Other Ambulatory Visit: Payer: Self-pay

## 2020-04-13 ENCOUNTER — Ambulatory Visit: Payer: 59 | Admitting: Cardiovascular Disease

## 2020-04-13 ENCOUNTER — Encounter: Payer: Self-pay | Admitting: Cardiovascular Disease

## 2020-04-13 VITALS — BP 120/90 | HR 56 | Ht 76.0 in | Wt 318.4 lb

## 2020-04-13 DIAGNOSIS — I4510 Unspecified right bundle-branch block: Secondary | ICD-10-CM

## 2020-04-13 DIAGNOSIS — E6609 Other obesity due to excess calories: Secondary | ICD-10-CM | POA: Diagnosis not present

## 2020-04-13 DIAGNOSIS — Z6837 Body mass index (BMI) 37.0-37.9, adult: Secondary | ICD-10-CM | POA: Diagnosis not present

## 2020-04-13 NOTE — Patient Instructions (Signed)
Medication Instructions:  No changes  If you need a refill on your cardiac medications before your next appointment, please call your pharmacy.    Lab work: No new labs needed   If you have labs (blood work) drawn today and your tests are completely normal, you will receive your results only by: . MyChart Message (if you have MyChart) OR . A paper copy in the mail If you have any lab test that is abnormal or we need to change your treatment, we will call you to review the results.   Testing/Procedures: No new testing needed   Follow-Up: At [facility], you and your health needs are our priority.  As part of our continuing mission to provide you with exceptional heart care, we have created designated Provider Care Teams.  These Care Teams include your primary Cardiologist (physician) and Advanced Practice Providers (APPs -  Physician Assistants and Nurse Practitioners) who all work together to provide you with the care you need, when you need it.  . You will need a follow up appointment as needed  . Providers on your designated Care Team:   . Christopher Berge, NP . Ryan Dunn, PA-C . Jacquelyn Visser, PA-C  Any Other Special Instructions Will Be Listed Below (If Applicable).  COVID-19 Vaccine Information can be found at: [email or web address] For questions related to vaccine distribution or appointments, please email [email or web address] or call [phone].     

## 2020-05-22 ENCOUNTER — Ambulatory Visit: Payer: 59 | Admitting: Family Medicine

## 2020-08-03 ENCOUNTER — Ambulatory Visit: Payer: 59 | Admitting: Family Medicine

## 2020-08-17 ENCOUNTER — Other Ambulatory Visit: Payer: Self-pay

## 2020-08-17 ENCOUNTER — Encounter: Payer: Self-pay | Admitting: Internal Medicine

## 2020-08-17 ENCOUNTER — Ambulatory Visit: Payer: 59 | Admitting: Internal Medicine

## 2020-08-17 VITALS — BP 138/84 | HR 70 | Temp 97.9°F | Wt 317.0 lb

## 2020-08-17 DIAGNOSIS — R1012 Left upper quadrant pain: Secondary | ICD-10-CM | POA: Diagnosis not present

## 2020-08-17 DIAGNOSIS — R109 Unspecified abdominal pain: Secondary | ICD-10-CM | POA: Diagnosis not present

## 2020-08-17 DIAGNOSIS — K5900 Constipation, unspecified: Secondary | ICD-10-CM | POA: Diagnosis not present

## 2020-08-17 MED ORDER — OMEPRAZOLE 20 MG PO CPDR: 20.0000 mg | DELAYED_RELEASE_CAPSULE | Freq: Every day | ORAL | 0 refills | Status: DC

## 2020-08-17 NOTE — Progress Notes (Signed)
Subjective:    Patient ID: Melvin Hill, male    DOB: 1980/08/18, 40 y.o.   MRN: [id number]  HPI  Pt presents to the clinic today with c/o abdominal pain. This started 1 week ago. He describes the pain as dull and achy. The pain radiates into his left flank. He feels like it may get slightly worse with eating. He denies urinary symptoms. He has had some intermittent constipation, but pain does not improve after having a BM. He denies nausea, vomiting or diarrhea. He denies chest pain or SOB. He has been taking Mirilax and fiber with minimal relief of symptoms. He did test positive for Covid 12 days ago.  Review of Systems      Past Medical History:  Diagnosis Date  . History of chicken pox   . Obesity     No current outpatient medications on file.   No current facility-administered medications for this visit.    No Known Allergies  Family History  Problem Relation Age of Onset  . Diabetes Father   . Diabetes Maternal Grandfather   . Stroke Maternal Grandfather   . Diabetes Maternal Grandmother   . Coronary artery disease Maternal Grandmother   . Cancer Neg Hx   . Hypertension Neg Hx     Social History   Socioeconomic History  . Marital status: Married    Spouse name: Not on file  . Number of children: 0  . Years of education: Not on file  . Highest education level: Not on file  Occupational History  . Occupation: Careers information officer: UNEMPLOYED  Tobacco Use  . Smoking status: Never Smoker  . Smokeless tobacco: Never Used  Substance and Sexual Activity  . Alcohol use: Yes    Comment: Very rare  . Drug use: No  . Sexual activity: Not on file  Other Topics Concern  . Not on file  Social History Narrative   Caffeine: some soda, tea 2-3 cups/day   Lives with fiancee, 1 dog   Was football lineman during college   Edu: [employer], exercise sports science   Occupation: IT sales professional   Activity: working out - [employer], some running (1 mi/day),  training at station   Diet: some water, some vegetables.   Social Determinants of Health   Financial Resource Strain: Not on file  Food Insecurity: Not on file  Transportation Needs: Not on file  Physical Activity: Not on file  Stress: Not on file  Social Connections: Not on file  Intimate Partner Violence: Not on file     Constitutional: Denies fever, malaise, fatigue, headache or abrupt weight changes.  Respiratory: Denies difficulty breathing, shortness of breath, cough or sputum production.   Cardiovascular: Denies chest pain, chest tightness, palpitations or swelling in the hands or feet.  Gastrointestinal: Pt reports LUQ pain. Denies abdominal pain, bloating, constipation, diarrhea or blood in the stool.  GU: Denies urgency, frequency, pain with urination, burning sensation, blood in urine, odor or discharge. Musculoskeletal: Pt reports left flank pain. Denies decrease in range of motion, difficulty with gait, or joint swelling.  Skin: Denies redness, rashes, lesions or ulcercations.   No other specific complaints in a complete review of systems (except as listed in HPI above).  Objective:   Physical Exam   BP 138/84   Pulse 70   Temp 97.9 F (36.6 C) (Temporal)   Wt (!) 317 lb (143.8 kg)   SpO2 98%   BMI 38.59 kg/m   Wt Readings  from Last 3 Encounters:  04/13/20 (!) 318 lb 6 oz (144.4 kg)  03/09/20 (!) 320 lb 12 oz (145.5 kg)  02/25/20 (!) 315 lb 8 oz (143.1 kg)    General: Appears his stated age, obese, in NAD. Skin: Warm, dry and intact. No rashes noted. Cardiovascular: Normal rate and rhythm.  Pulmonary/Chest: Normal effort and positive vesicular breath sounds. No respiratory distress. No wheezes, rales or ronchi noted.  Abdomen: Soft and nontender. Normal bowel sounds. No distention or masses noted. Liver, spleen and kidneys non palpable. No CVA tenderness noted. Musculoskeletal: No pain with palpation of the left para thoracic muscles. Neurological: Alert  and oriented.    BMET    Component Value Date/Time   NA 139 10/26/2019 1711   K 4.2 10/26/2019 1711   CL 105 10/26/2019 1711   CO2 27 10/26/2019 1711   GLUCOSE 101 (H) 10/26/2019 1711   BUN 19 10/26/2019 1711   CREATININE 1.04 10/26/2019 1711   CALCIUM 9.4 10/26/2019 1711   GFRNONAA >60 10/26/2019 1711   GFRAA >60 10/26/2019 1711    Lipid Panel  No results found for: CHOL, TRIG, HDL, CHOLHDL, VLDL, LDLCALC  CBC    Component Value Date/Time   WBC 7.8 10/26/2019 1711   RBC 5.09 10/26/2019 1711   HGB 15.3 10/26/2019 1711   HCT 43.7 10/26/2019 1711   PLT 294 10/26/2019 1711   MCV 85.9 10/26/2019 1711   MCH 30.1 10/26/2019 1711   MCHC 35.0 10/26/2019 1711   RDW 12.4 10/26/2019 1711   LYMPHSABS 2.4 10/26/2019 1711   MONOABS 0.6 10/26/2019 1711   EOSABS 0.0 10/26/2019 1711   BASOSABS 0.0 10/26/2019 1711    Hgb A1C No results found for: HGBA1C         Assessment & Plan:   LUQ Pain, Left Flank Pain, Constipation:  Does not seem muscular No indication for kidney stone Will check CBC, CMET, Amylase, Lipase and H Pylori today Consider CT abdomen with contrast if pain persist or worsen  Return precautions discussed  Nicki Reaper, NP This visit occurred during the SARS-CoV-2 public health emergency.  Safety protocols were in place, including screening questions prior to the visit, additional usage of staff PPE, and extensive cleaning of exam room while observing appropriate contact time as indicated for disinfecting solutions.

## 2020-08-17 NOTE — Patient Instructions (Signed)

## 2020-08-18 ENCOUNTER — Encounter: Payer: Self-pay | Admitting: Internal Medicine

## 2020-08-18 DIAGNOSIS — R1013 Epigastric pain: Secondary | ICD-10-CM

## 2020-08-18 LAB — COMPREHENSIVE METABOLIC PANEL
ALT: 20 U/L (ref 0–53)
AST: 16 U/L (ref 0–37)
Albumin: 4.9 g/dL (ref 3.5–5.2)
Alkaline Phosphatase: 49 U/L (ref 39–117)
BUN: 15 mg/dL (ref 6–23)
CO2: 28 mEq/L (ref 19–32)
Calcium: 10.1 mg/dL (ref 8.4–10.5)
Chloride: 102 mEq/L (ref 96–112)
Creatinine, Ser: 1.08 mg/dL (ref 0.40–1.50)
GFR: 87.24 mL/min (ref >60.00)
Glucose, Bld: 96 mg/dL (ref 70–99)
Potassium: 4.7 mEq/L (ref 3.5–5.1)
Sodium: 138 mEq/L (ref 135–145)
Total Bilirubin: 0.6 mg/dL (ref 0.2–1.2)
Total Protein: 7.4 g/dL (ref 6.0–8.3)

## 2020-08-18 LAB — CBC
HCT: 44.4 % (ref 39.0–52.0)
Hemoglobin: 15.5 g/dL (ref 13.0–17.0)
MCHC: 35 g/dL (ref 30.0–36.0)
MCV: 85.6 fl (ref 78.0–100.0)
Platelets: 310 10*3/uL (ref 150.0–400.0)
RBC: 5.18 Mil/uL (ref 4.22–5.81)
RDW: 12.6 % (ref 11.5–15.5)
WBC: 9.1 10*3/uL (ref 4.0–10.5)

## 2020-08-18 LAB — H. PYLORI ANTIBODY, IGG: H Pylori IgG: NEGATIVE

## 2020-08-18 LAB — AMYLASE: Amylase: 30 U/L (ref 27–131)

## 2020-08-18 LAB — LIPASE: Lipase: 16 U/L (ref 11.0–59.0)

## 2020-08-27 ENCOUNTER — Encounter: Payer: 59 | Admitting: Family Medicine

## 2020-09-07 ENCOUNTER — Other Ambulatory Visit: Payer: Self-pay

## 2020-09-07 ENCOUNTER — Other Ambulatory Visit: Payer: Self-pay | Admitting: Nurse Practitioner

## 2020-09-07 ENCOUNTER — Ambulatory Visit
Admission: RE | Admit: 2020-09-07 | Discharge: 2020-09-07 | Disposition: A | Discharge status: Home / Self Care | Payer: No Typology Code available for payment source | Source: Ambulatory Visit | Attending: Nurse Practitioner | Admitting: Nurse Practitioner

## 2020-09-07 DIAGNOSIS — M25512 Pain in left shoulder: Secondary | ICD-10-CM

## 2020-09-07 IMAGING — CR DG SHOULDER 2+V*L*
3 series · 3 of 3 positions shown · non-contrast
Comparison: None.

CLINICAL DATA: Pain following fall

EXAM:
LEFT SHOULDER - 2+ VIEW

[w shoulder grashey left]
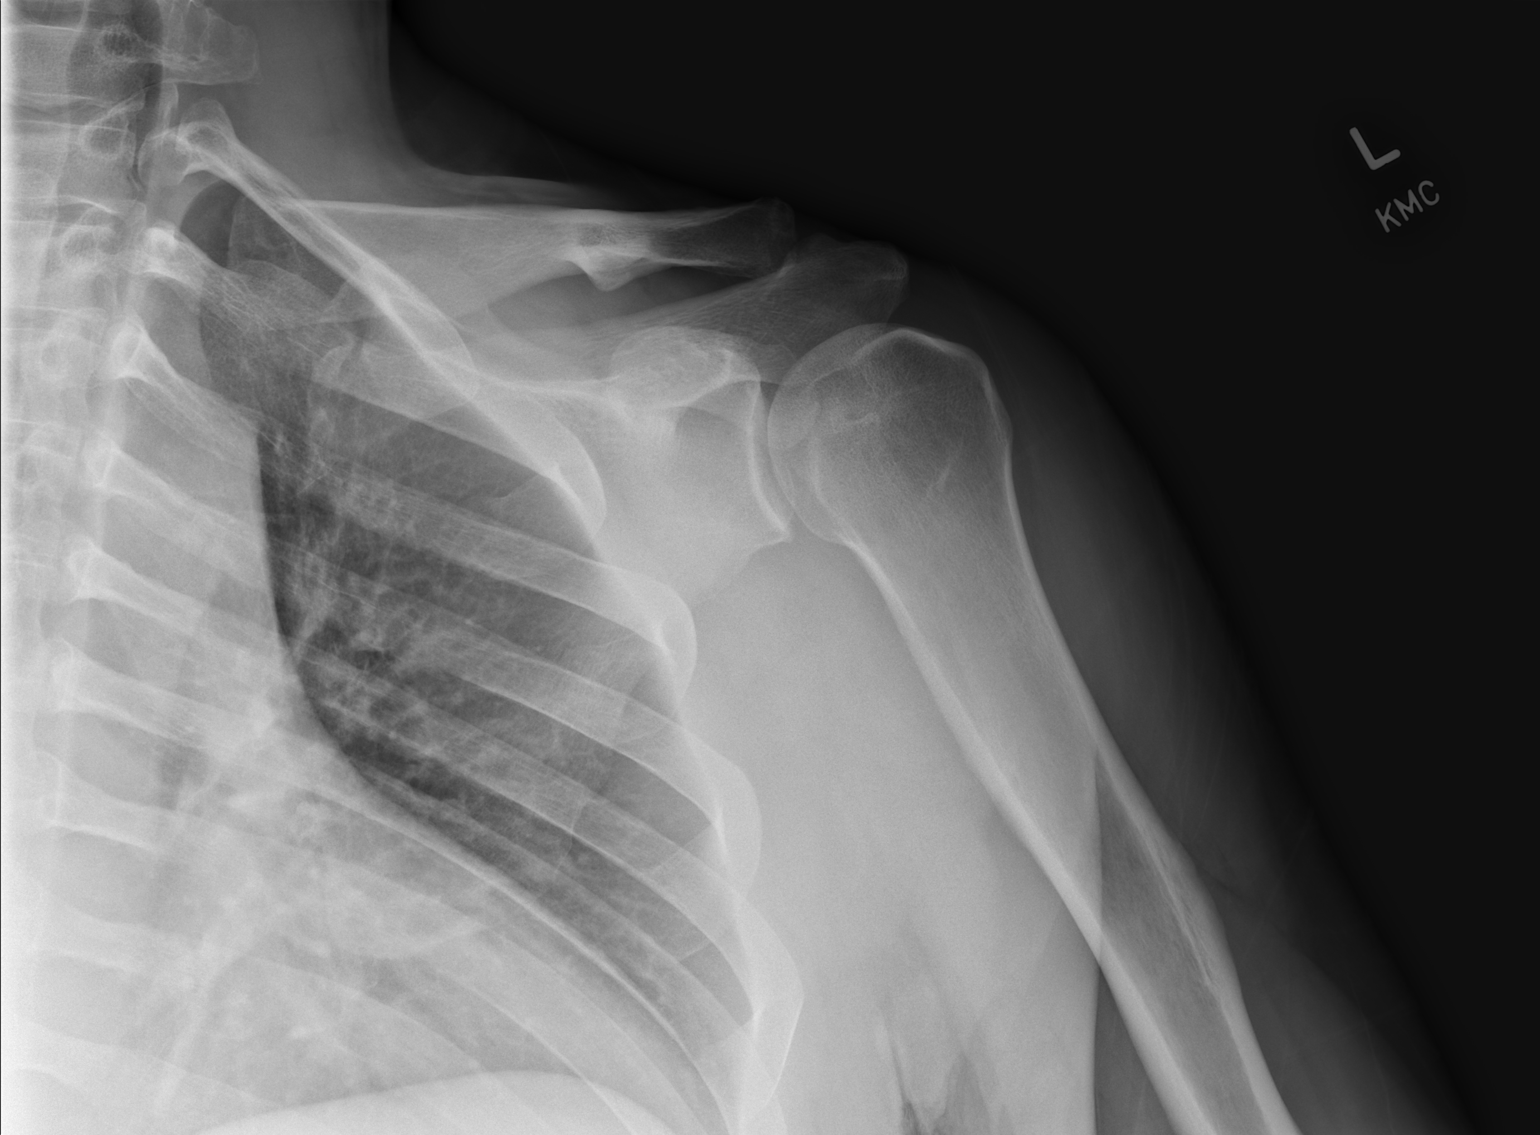

[w shoulder y-view left]
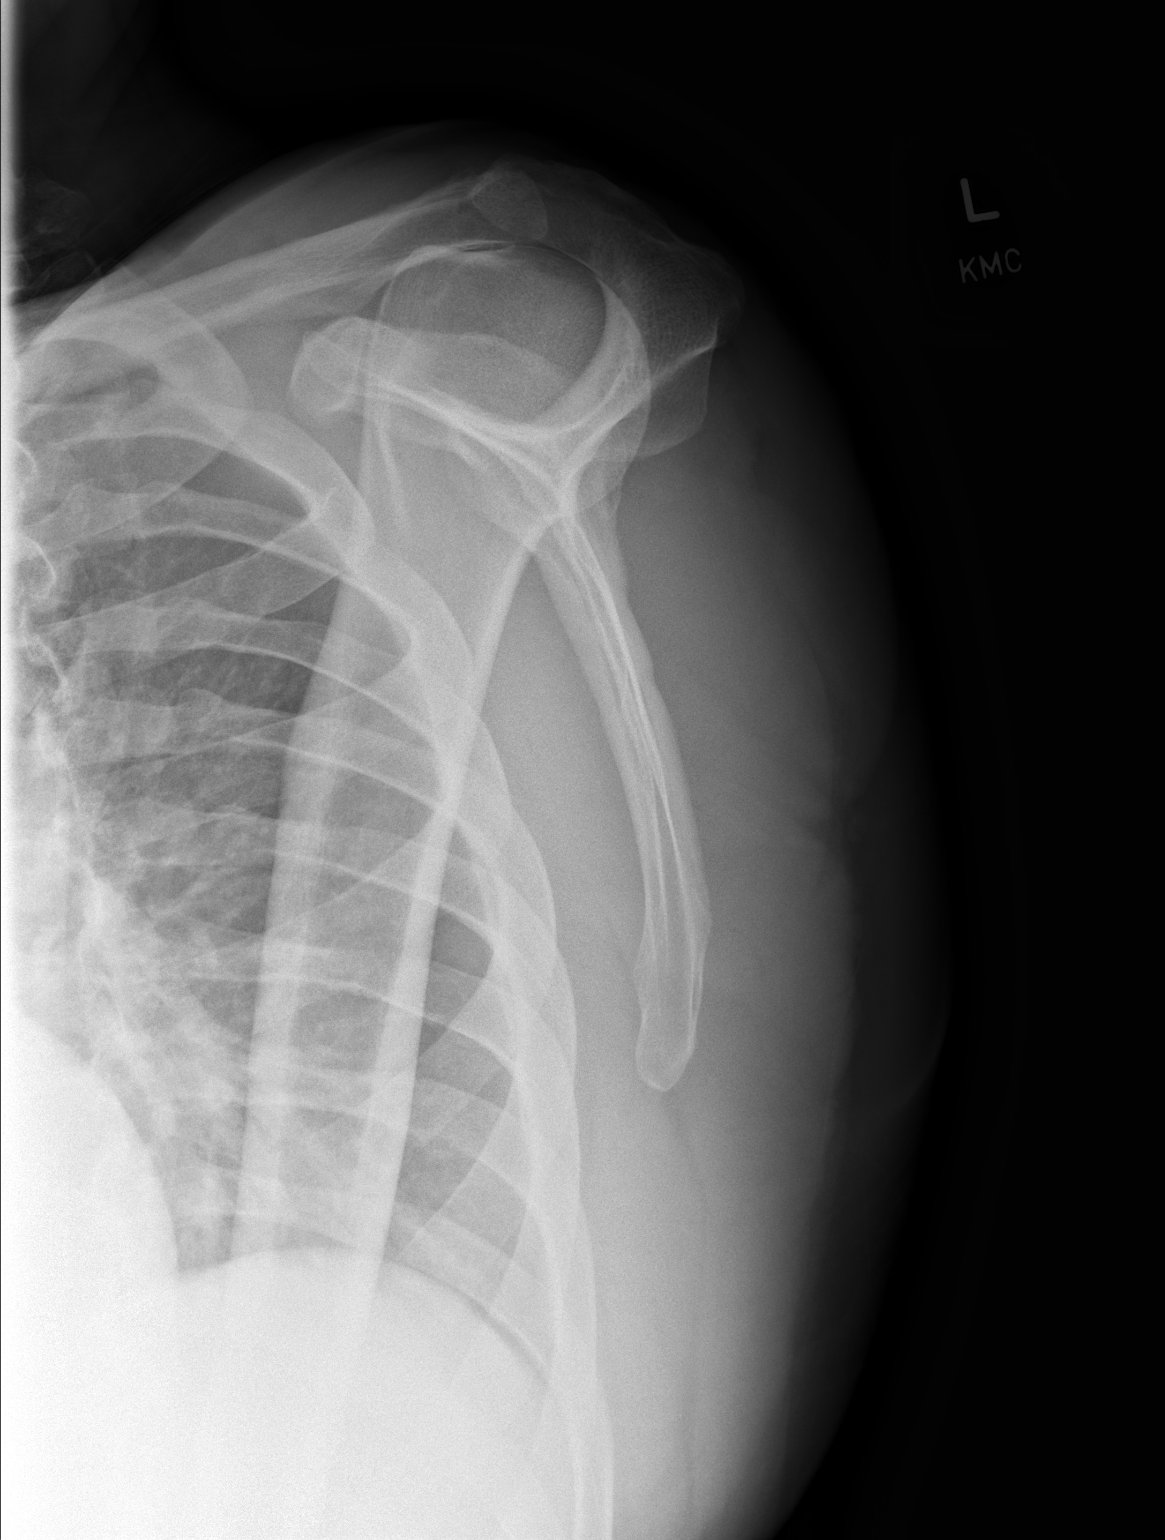

[x shoulder axillary left]
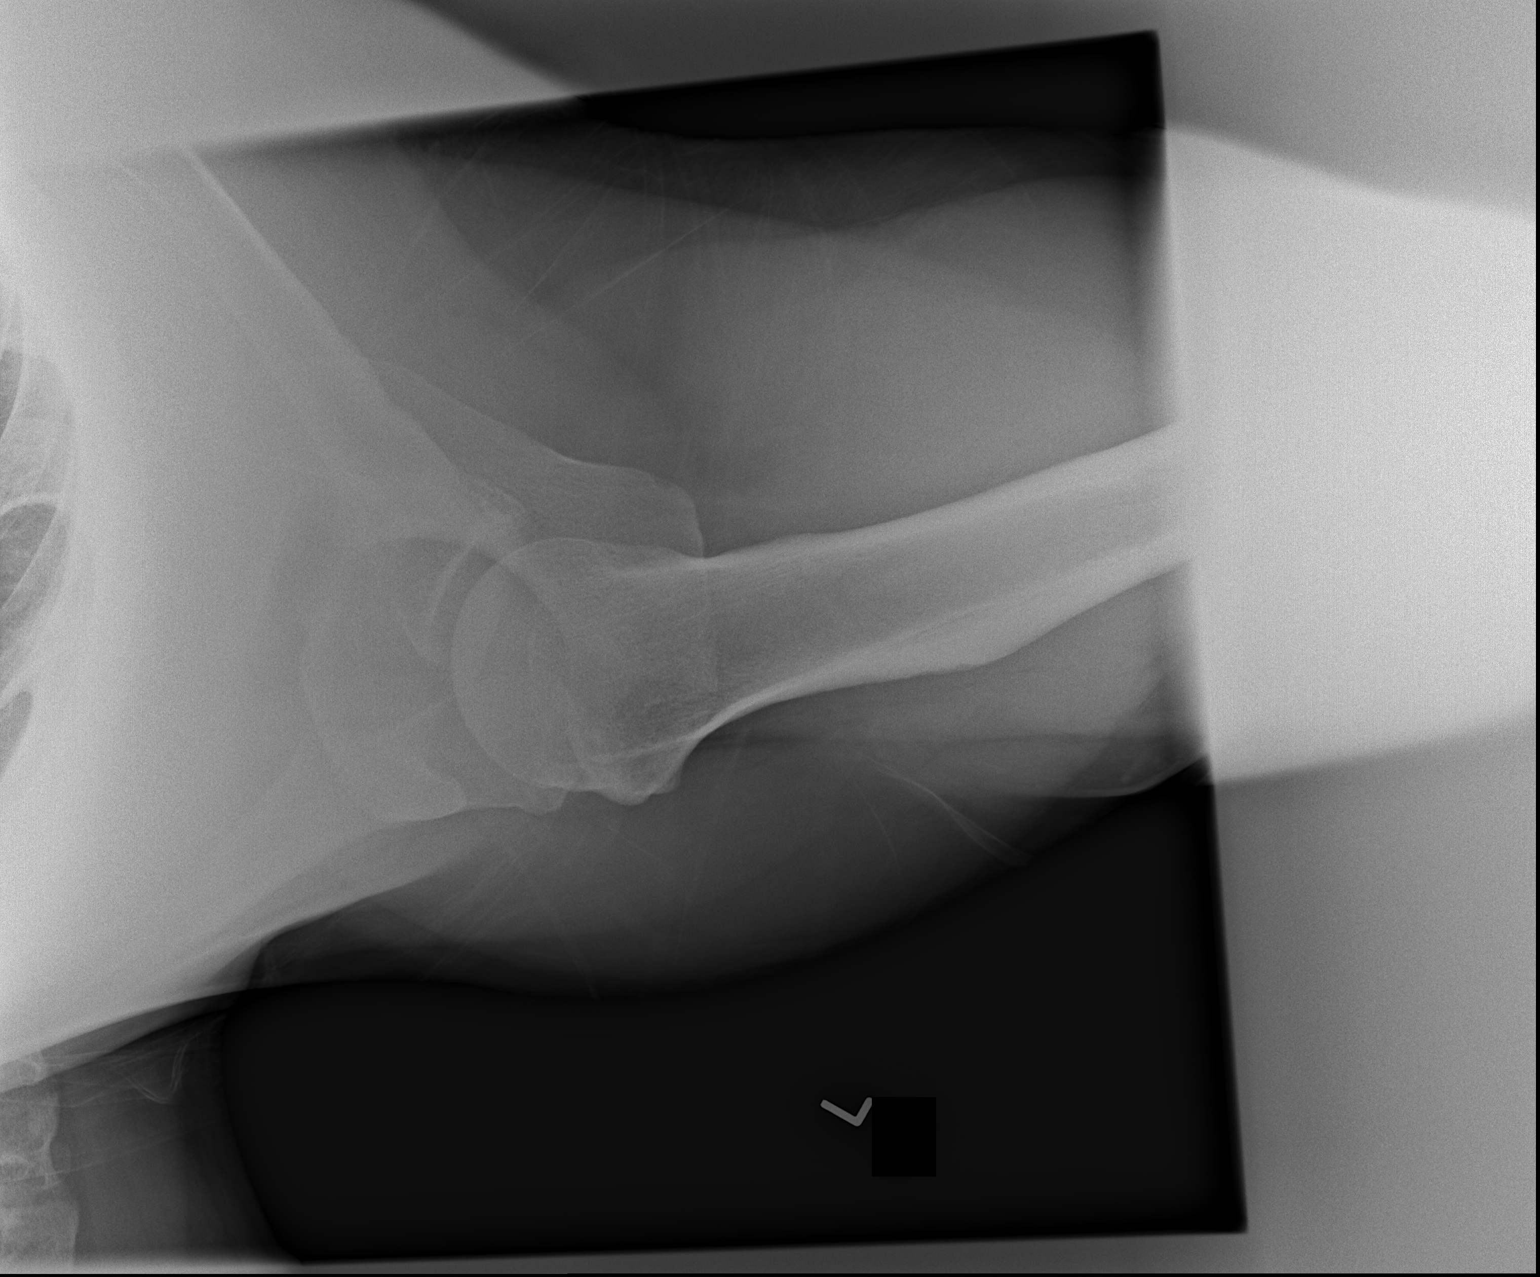

[3 of 3 positions shown; findings below may reference images not displayed]

FINDINGS: Oblique, Y scapular, and axillary images were obtained. No fracture
or dislocation. Joint spaces appear normal. No erosive change.
Visualized left lung clear.
IMPRESSION: No fracture or dislocation.  No evident arthropathy.

## 2020-09-11 ENCOUNTER — Encounter: Payer: 59 | Admitting: Internal Medicine

## 2020-09-11 DIAGNOSIS — Z0289 Encounter for other administrative examinations: Secondary | ICD-10-CM

## 2020-09-11 NOTE — Progress Notes (Deleted)
HPI  Pt presents to the clinic today to establish care and for management of the conditions listed below. He is transferring care from Deboraha Sprang, NP-C  GERD: He denies breakthrough on Omeprazole. There is no upper GI on file.  Flu: Tetanus: Covid: Dentist:  Past Medical History:  Diagnosis Date  . History of chicken pox   . Obesity     Current Outpatient Medications  Medication Sig Dispense Refill  . omeprazole (PRILOSEC) 20 MG capsule Take 1 capsule (20 mg total) by mouth daily. 30 capsule 0   No current facility-administered medications for this visit.    No Known Allergies  Family History  Problem Relation Age of Onset  . Diabetes Father   . Diabetes Maternal Grandfather   . Stroke Maternal Grandfather   . Diabetes Maternal Grandmother   . Coronary artery disease Maternal Grandmother   . Cancer Neg Hx   . Hypertension Neg Hx     Social History   Socioeconomic History  . Marital status: Married    Spouse name: Not on file  . Number of children: 0  . Years of education: Not on file  . Highest education level: Not on file  Occupational History  . Occupation: Careers information officer: UNEMPLOYED  Tobacco Use  . Smoking status: Never Smoker  . Smokeless tobacco: Never Used  Substance and Sexual Activity  . Alcohol use: Yes    Comment: Very rare  . Drug use: No  . Sexual activity: Not on file  Other Topics Concern  . Not on file  Social History Narrative   Caffeine: some soda, tea 2-3 cups/day   Lives with fiancee, 1 dog   Was football lineman during college   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   Social Determinants of Health   Financial Resource Strain: Not on file  Food Insecurity: Not on file  Transportation Needs: Not on file  Physical Activity: Not on file  Stress: Not on file   Social Connections: Not on file  Intimate Partner Violence: Not on file    ROS:  Constitutional: Denies fever, malaise, fatigue, headache or abrupt weight changes.  HEENT: Denies eye pain, eye redness, ear pain, ringing in the ears, wax buildup, runny nose, nasal congestion, bloody nose, or sore throat. Respiratory: Denies difficulty breathing, shortness of breath, cough or sputum production.   Cardiovascular: Denies chest pain, chest tightness, palpitations or swelling in the hands or feet.  Gastrointestinal: Denies abdominal pain, bloating, constipation, diarrhea or blood in the stool.  GU: Denies frequency, urgency, pain with urination, blood in urine, odor or discharge. Musculoskeletal: Denies decrease in range of motion, difficulty with gait, muscle pain or joint pain and swelling.  Skin: Denies redness, rashes, lesions or ulcercations.  Neurological: Denies dizziness, difficulty with memory, difficulty with speech or problems with balance and coordination.  Psych: Denies anxiety, depression, SI/HI.  No other specific complaints in a complete review of systems (except as listed in HPI above).  PE:  There were no vitals taken for this visit. Wt Readings from Last 3 Encounters:  08/17/20 (!) 317 lb (143.8 kg)  04/13/20 (!) 318 lb 6 oz (144.4 kg)  03/09/20 (!) 320 lb 12 oz (145.5 kg)    General: Appears their stated age, well developed, well nourished in NAD. HEENT: Head: normal shape and size; Eyes:  sclera white, no icterus, conjunctiva pink, PERRLA and EOMs intact; Ears: Tm's gray and intact, normal light reflex;Throat/Mouth: Teeth present, mucosa pink and moist, no lesions or ulcerations noted.  Neck: Neck supple, trachea midline. No masses, lumps or thyromegaly present.  Cardiovascular: Normal rate and rhythm. S1,S2 noted.  No murmur, rubs or gallops noted. No JVD or BLE edema. No carotid bruits noted. Pulmonary/Chest: Normal effort and positive vesicular breath sounds. No  respiratory distress. No wheezes, rales or ronchi noted.  Abdomen: Soft and nontender. Normal bowel sounds, no bruits noted. No distention or masses noted. Liver, spleen and kidneys non palpable. Musculoskeletal: Normal range of motion. Strength 5/5 BUE/BLE. No signs of joint swelling. No difficulty with gait.  Neurological: Alert and oriented. Cranial nerves II-XII grossly intact. Coordination normal.  Psychiatric: Mood and affect normal. Behavior is normal. Judgment and thought content normal.   EKG:  BMET    Component Value Date/Time   NA 138 08/17/2020 1521   K 4.7 08/17/2020 1521   CL 102 08/17/2020 1521   CO2 28 08/17/2020 1521   GLUCOSE 96 08/17/2020 1521   BUN 15 08/17/2020 1521   CREATININE 1.08 08/17/2020 1521   CALCIUM 10.1 08/17/2020 1521   GFRNONAA >60 10/26/2019 1711   GFRAA >60 10/26/2019 1711    Lipid Panel  No results found for: CHOL, TRIG, HDL, CHOLHDL, VLDL, LDLCALC  CBC    Component Value Date/Time   WBC 9.1 08/17/2020 1521   RBC 5.18 08/17/2020 1521   HGB 15.5 08/17/2020 1521   HCT 44.4 08/17/2020 1521   PLT 310.0 08/17/2020 1521   MCV 85.6 08/17/2020 1521   MCH 30.1 10/26/2019 1711   MCHC 35.0 08/17/2020 1521   RDW 12.6 08/17/2020 1521   LYMPHSABS 2.4 10/26/2019 1711   MONOABS 0.6 10/26/2019 1711   EOSABS 0.0 10/26/2019 1711   BASOSABS 0.0 10/26/2019 1711    Hgb A1C No results found for: HGBA1C   Assessment and Plan:  Nicki Reaper, NP This visit occurred during the SARS-CoV-2 public health emergency.  Safety protocols were in place, including screening questions prior to the visit, additional usage of staff PPE, and extensive cleaning of exam room while observing appropriate contact time as indicated for disinfecting solutions.

## 2020-09-23 ENCOUNTER — Ambulatory Visit: Payer: 59 | Admitting: Internal Medicine

## 2020-09-23 ENCOUNTER — Other Ambulatory Visit: Payer: Self-pay

## 2020-09-23 ENCOUNTER — Encounter: Payer: Self-pay | Admitting: Internal Medicine

## 2020-09-23 VITALS — BP 130/86 | HR 75 | Temp 98.2°F | Wt 316.0 lb

## 2020-09-23 DIAGNOSIS — K219 Gastro-esophageal reflux disease without esophagitis: Secondary | ICD-10-CM

## 2020-09-23 DIAGNOSIS — R1013 Epigastric pain: Secondary | ICD-10-CM

## 2020-09-23 DIAGNOSIS — R1012 Left upper quadrant pain: Secondary | ICD-10-CM

## 2020-09-23 MED ORDER — OMEPRAZOLE 20 MG PO CPDR: 20.0000 mg | DELAYED_RELEASE_CAPSULE | Freq: Two times a day (BID) | ORAL | 2 refills | Status: DC

## 2020-09-23 NOTE — Patient Instructions (Signed)

## 2020-09-23 NOTE — Progress Notes (Signed)
Subjective:    Patient ID: Melvin Hill, male    DOB: 01/30/1981, 40 y.o.   MRN: [id number]  HPI  Pt presents to the clinic today for follow up of LUQ/epigastric pain. He was seen 1/24 for the same, started on Omeprazole. He reports this medication has helped some, it seemed to help most when he took it twice in 1 day. He reports his constipation issues have gotten better. He denies nausea, vomiting, diarrhea, blood in his stool. He reports the pain was more intense over the last 2-3 days but better today.  Review of Systems      Past Medical History:  Diagnosis Date  . History of chicken pox   . Obesity     Current Outpatient Medications  Medication Sig Dispense Refill  . omeprazole (PRILOSEC) 20 MG capsule Take 1 capsule (20 mg total) by mouth daily. 30 capsule 0   No current facility-administered medications for this visit.    No Known Allergies  Family History  Problem Relation Age of Onset  . Diabetes Father   . Diabetes Maternal Grandfather   . Stroke Maternal Grandfather   . Diabetes Maternal Grandmother   . Coronary artery disease Maternal Grandmother   . Cancer Neg Hx   . Hypertension Neg Hx     Social History   Socioeconomic History  . Marital status: Married    Spouse name: Not on file  . Number of children: 0  . Years of education: Not on file  . Highest education level: Not on file  Occupational History  . Occupation: Careers information officer: UNEMPLOYED  Tobacco Use  . Smoking status: Never Smoker  . Smokeless tobacco: Never Used  Substance and Sexual Activity  . Alcohol use: Yes    Comment: Very rare  . Drug use: No  . Sexual activity: Not on file  Other Topics Concern  . Not on file  Social History Narrative   Caffeine: some soda, tea 2-3 cups/day   Lives with fiancee, 1 dog   Was football lineman during college   Edu: [employer], exercise sports science   Occupation: IT sales professional   Activity: working out - [employer], some  running (1 mi/day), training at station   Diet: some water, some vegetables.   Social Determinants of Health   Financial Resource Strain: Not on file  Food Insecurity: Not on file  Transportation Needs: Not on file  Physical Activity: Not on file  Stress: Not on file  Social Connections: Not on file  Intimate Partner Violence: Not on file     Constitutional: Denies fever, malaise, fatigue, headache or abrupt weight changes.  Respiratory: Denies difficulty breathing, shortness of breath, cough or sputum production.   Cardiovascular: Denies chest pain, chest tightness, palpitations or swelling in the hands or feet.  Gastrointestinal: Pt reports LUQ/epigastric abdominal pain. Denies bloating, constipation, diarrhea or blood in the stool.  GU: Denies urgency, frequency, pain with urination, burning sensation, blood in urine, odor or discharge.  No other specific complaints in a complete review of systems (except as listed in HPI above).  Objective:   Physical Exam   BP 130/86   Pulse 75   Temp 98.2 F (36.8 C) (Temporal)   Wt (!) 316 lb (143.3 kg)   SpO2 98%   BMI 38.46 kg/m   Wt Readings from Last 3 Encounters:  08/17/20 (!) 317 lb (143.8 kg)  04/13/20 (!) 318 lb 6 oz (144.4 kg)  03/09/20 (!) 320 lb  12 oz (145.5 kg)    General: Appears his stated age, obese, in NAD. Cardiovascular: Normal rate and rhythm. S1,S2 noted.  No murmur, rubs or gallops noted. No JVD or BLE edema. No carotid bruits noted. Pulmonary/Chest: Normal effort and positive vesicular breath sounds. No respiratory distress. No wheezes, rales or ronchi noted.  Abdomen: Soft and nontender. Normal bowel sounds. No distention or masses noted. Liver, spleen and kidneys non palpable. Neurological: Alert and oriented.   BMET    Component Value Date/Time   NA 138 08/17/2020 1521   K 4.7 08/17/2020 1521   CL 102 08/17/2020 1521   CO2 28 08/17/2020 1521   GLUCOSE 96 08/17/2020 1521   BUN 15 08/17/2020 1521    CREATININE 1.08 08/17/2020 1521   CALCIUM 10.1 08/17/2020 1521   GFRNONAA >60 10/26/2019 1711   GFRAA >60 10/26/2019 1711    Lipid Panel  No results found for: CHOL, TRIG, HDL, CHOLHDL, VLDL, LDLCALC  CBC    Component Value Date/Time   WBC 9.1 08/17/2020 1521   RBC 5.18 08/17/2020 1521   HGB 15.5 08/17/2020 1521   HCT 44.4 08/17/2020 1521   PLT 310.0 08/17/2020 1521   MCV 85.6 08/17/2020 1521   MCH 30.1 10/26/2019 1711   MCHC 35.0 08/17/2020 1521   RDW 12.6 08/17/2020 1521   LYMPHSABS 2.4 10/26/2019 1711   MONOABS 0.6 10/26/2019 1711   EOSABS 0.0 10/26/2019 1711   BASOSABS 0.0 10/26/2019 1711    Hgb A1C No results found for: HGBA1C         Assessment & Plan:   LUQ/Epigastric Pain:  Increase Omeprazole to 20 mg BID Offered CT abdomen- he would like to take increased dose of Omeprazole x 2 weeks, if pain persist will agree to CT scan Consider GI referral for further evaluation  Return precautions disucssed Nicki Reaper, NP This visit occurred during the SARS-CoV-2 public health emergency.  Safety protocols were in place, including screening questions prior to the visit, additional usage of staff PPE, and extensive cleaning of exam room while observing appropriate contact time as indicated for disinfecting solutions.

## 2020-09-23 NOTE — Telephone Encounter (Signed)
Yeah he just needs to follow up with me. May need scan before referral.

## 2020-09-29 NOTE — Telephone Encounter (Signed)
Needs to schedule an appt for follow up

## 2020-09-30 NOTE — Telephone Encounter (Signed)
CT ordered. 

## 2020-10-01 ENCOUNTER — Encounter: Payer: Self-pay | Admitting: Family Medicine

## 2020-10-01 NOTE — Telephone Encounter (Signed)
Message to patient about upcoming appt  Needs appt instructions for upcoming CT 10/16/20 at 9:00am Arrive at 8:45pm Location: [facility] [address] [place] NPO 4 hours prior Pick up contrast bottles from [employer] a few days prior to appt - they will give instructions of when to drink prior to appt time.

## 2020-10-01 NOTE — Telephone Encounter (Signed)
Called and verified with patient that someone reached out to him regarding ct scan.

## 2020-10-05 ENCOUNTER — Encounter: Payer: Self-pay | Admitting: Emergency Medicine

## 2020-10-05 ENCOUNTER — Other Ambulatory Visit: Payer: Self-pay

## 2020-10-05 ENCOUNTER — Emergency Department: Payer: 59

## 2020-10-05 ENCOUNTER — Emergency Department
Admission: EM | Admit: 2020-10-05 | Discharge: 2020-10-05 | Disposition: A | Discharge status: Home / Self Care | Payer: 59 | Attending: Emergency Medicine | Admitting: Emergency Medicine

## 2020-10-05 DIAGNOSIS — K219 Gastro-esophageal reflux disease without esophagitis: Secondary | ICD-10-CM | POA: Insufficient documentation

## 2020-10-05 DIAGNOSIS — R1032 Left lower quadrant pain: Secondary | ICD-10-CM | POA: Diagnosis present

## 2020-10-05 DIAGNOSIS — K589 Irritable bowel syndrome without diarrhea: Secondary | ICD-10-CM | POA: Diagnosis not present

## 2020-10-05 LAB — URINALYSIS, COMPLETE (UACMP) WITH MICROSCOPIC
Bacteria, UA: NONE SEEN
Bilirubin Urine: NEGATIVE
Glucose, UA: NEGATIVE mg/dL
Hgb urine dipstick: NEGATIVE
Ketones, ur: NEGATIVE mg/dL
Leukocytes,Ua: NEGATIVE
Nitrite: NEGATIVE
Protein, ur: NEGATIVE mg/dL
Specific Gravity, Urine: 1.015 (ref 1.005–1.030)
pH: 6 (ref 5.0–8.0)

## 2020-10-05 LAB — COMPREHENSIVE METABOLIC PANEL
ALT: 24 U/L (ref 0–44)
AST: 22 U/L (ref 15–41)
Albumin: 4.8 g/dL (ref 3.5–5.0)
Alkaline Phosphatase: 43 U/L (ref 38–126)
Anion gap: 9 (ref 5–15)
BUN: 13 mg/dL (ref 6–20)
CO2: 25 mmol/L (ref 22–32)
Calcium: 9.5 mg/dL (ref 8.9–10.3)
Chloride: 104 mmol/L (ref 98–111)
Creatinine, Ser: 0.81 mg/dL (ref 0.61–1.24)
GFR, Estimated: >60 mL/min (ref >60)
Glucose, Bld: 142 mg/dL — ABNORMAL HIGH (ref 70–99)
Potassium: 3.9 mmol/L (ref 3.5–5.1)
Sodium: 138 mmol/L (ref 135–145)
Total Bilirubin: 1 mg/dL (ref 0.3–1.2)
Total Protein: 7.8 g/dL (ref 6.5–8.1)

## 2020-10-05 LAB — CBC
HCT: 43.2 % (ref 39.0–52.0)
Hemoglobin: 15.3 g/dL (ref 13.0–17.0)
MCH: 29.9 pg (ref 26.0–34.0)
MCHC: 35.4 g/dL (ref 30.0–36.0)
MCV: 84.5 fL (ref 80.0–100.0)
Platelets: 280 10*3/uL (ref 150–400)
RBC: 5.11 MIL/uL (ref 4.22–5.81)
RDW: 12.4 % (ref 11.5–15.5)
WBC: 8 10*3/uL (ref 4.0–10.5)
nRBC: 0 % (ref 0.0–0.2)

## 2020-10-05 LAB — LIPASE, BLOOD: Lipase: 29 U/L (ref 11–51)

## 2020-10-05 IMAGING — CT CT ABD-PELV W/ CM
2 of 4 series · 16 of 46 positions shown, 18 images · IV contrast (APPLIED)
Comparison: None.

CLINICAL DATA: Intermittent flank and left lower quadrant abdominal
pain.

EXAM:
CT ABDOMEN AND PELVIS WITH CONTRAST
TECHNIQUE: Multidetector CT imaging of the abdomen and pelvis was performed
using the standard protocol following bolus administration of
intravenous contrast.
CONTRAST:  125mL OMNIPAQUE IOHEXOL 300 MG/ML  SOLN

[Series 2: routine abd/pel with · axial · 0.91mm/px · z∈[-660,-170]mm · 13 of 108 slices shown, 15 images]
[im 5/108  soft-tissue]
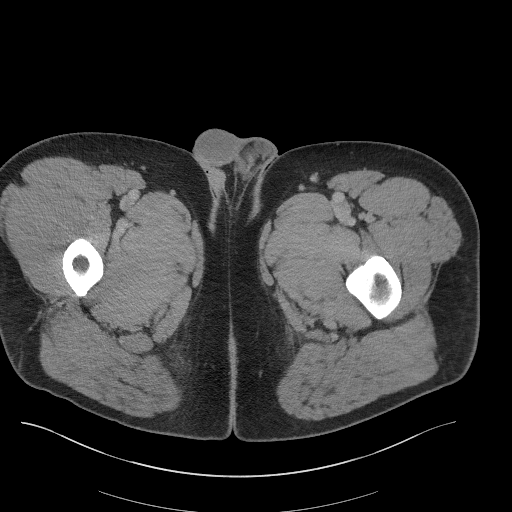
[im 5/108  bone]
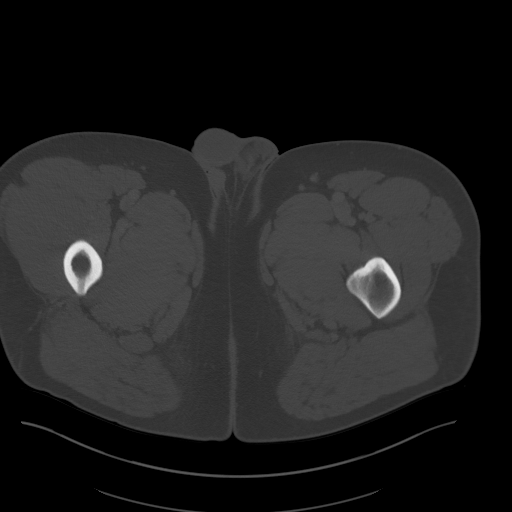
[im 14/108  soft-tissue]
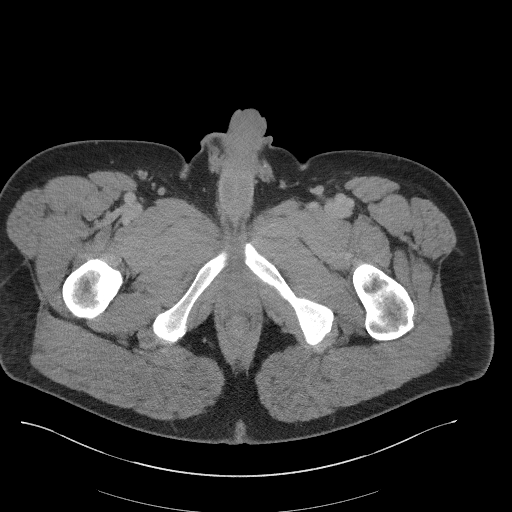
[im 23/108  soft-tissue]
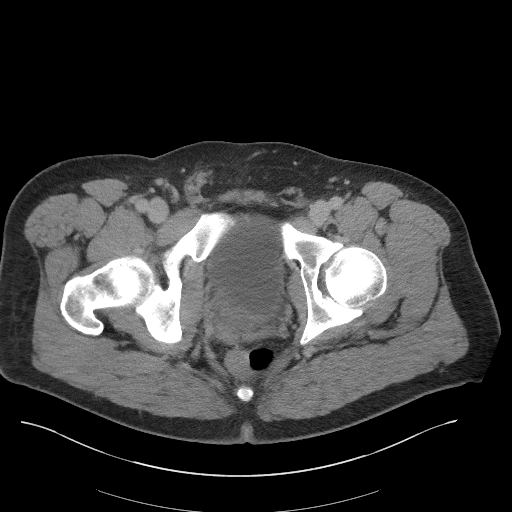
[im 32/108  soft-tissue]
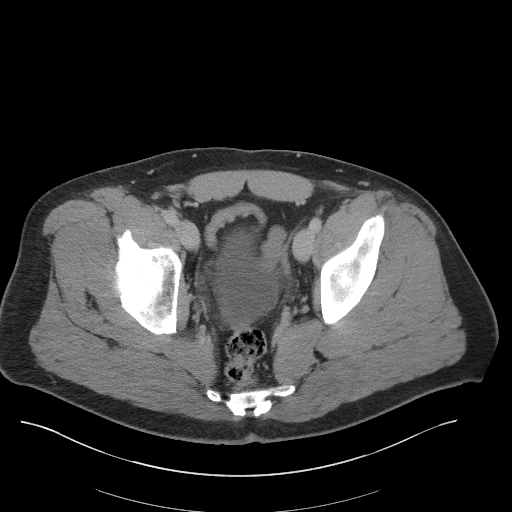
[im 36/108  soft-tissue]
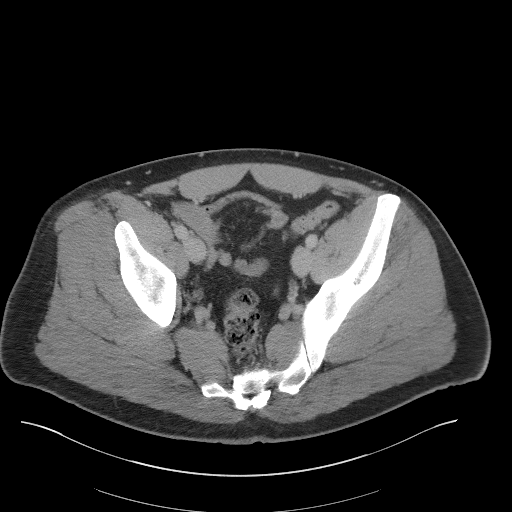
[im 45/108  soft-tissue]
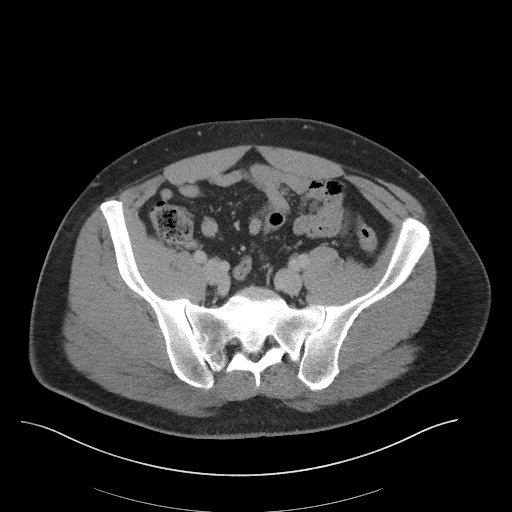
[im 54/108  soft-tissue]
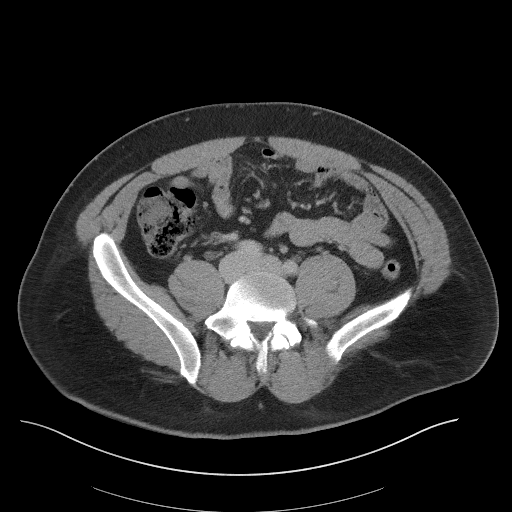
[im 63/108  soft-tissue]
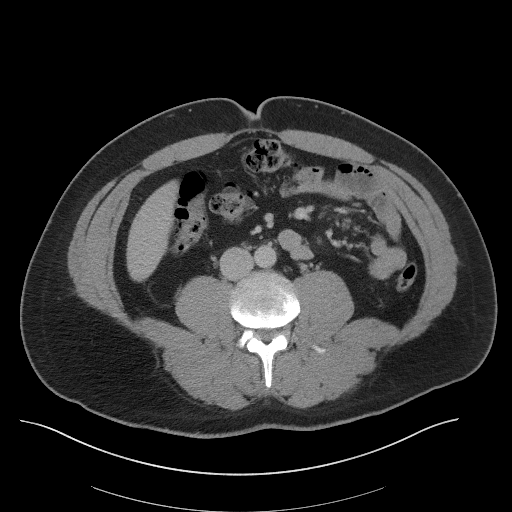
[im 72/108  soft-tissue]
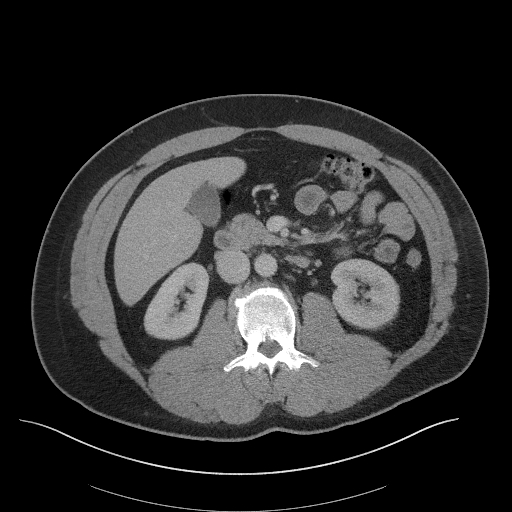
[im 72/108  bone]
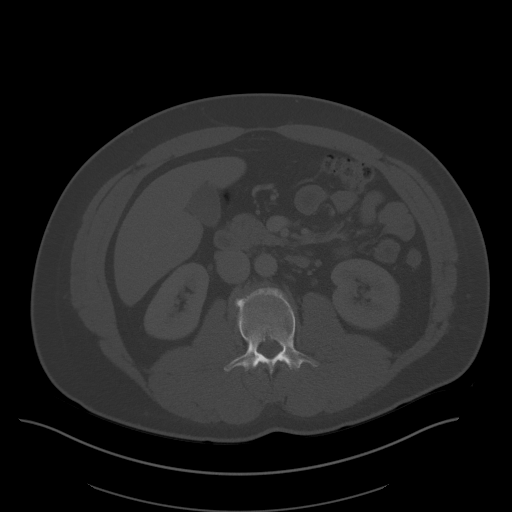
[im 76/108  soft-tissue]
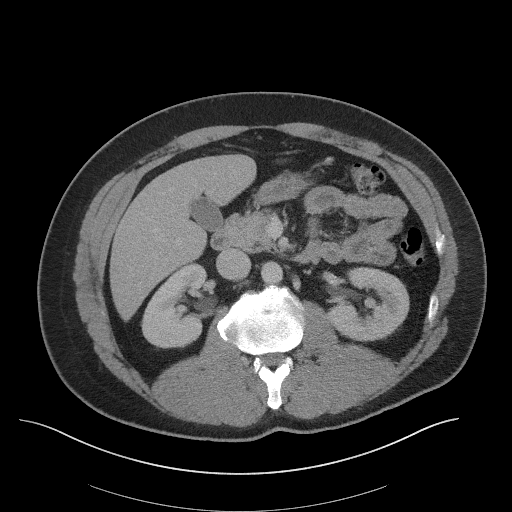
[im 85/108  soft-tissue]
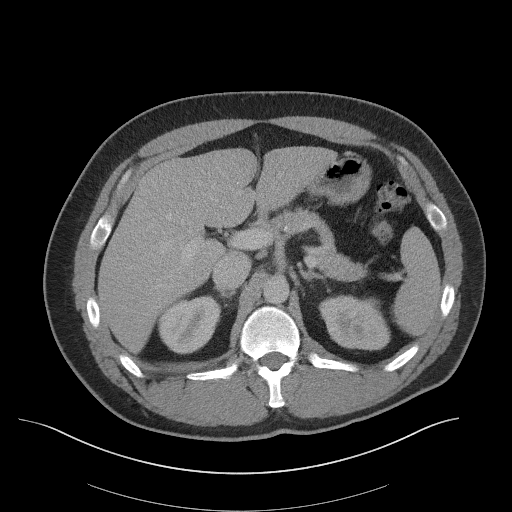
[im 94/108  soft-tissue]
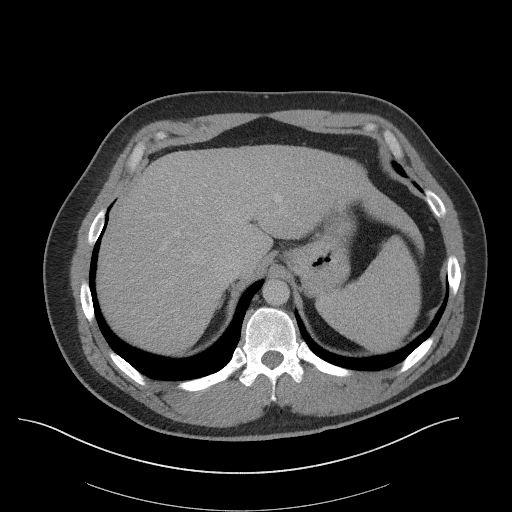
[im 103/108  soft-tissue]
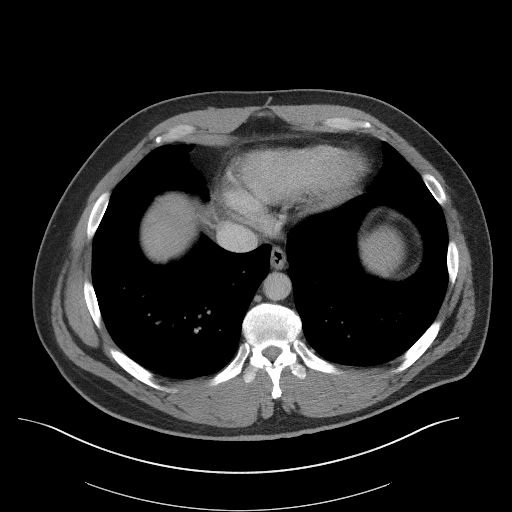

[Series 5: coronal st · coronal · 0.94mm/px · 3 of 104 slices shown]
[im 35/104  soft-tissue]
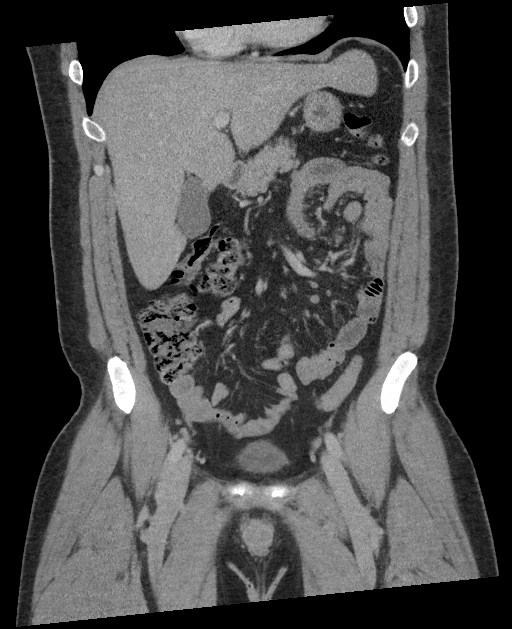
[im 46/104  soft-tissue]
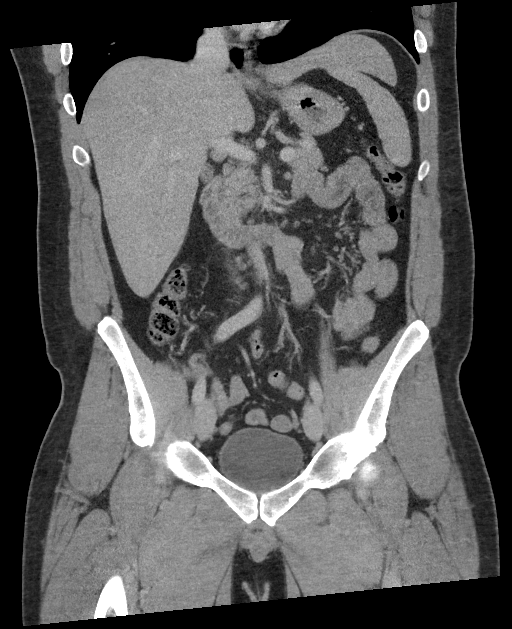
[im 58/104  soft-tissue]
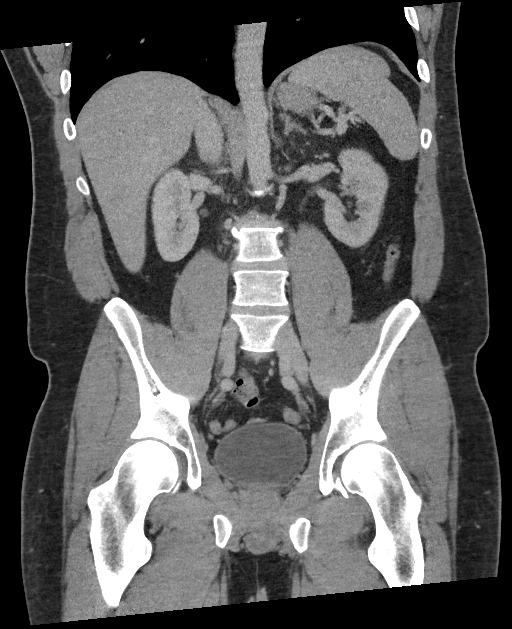

[16 of 46 positions shown; findings below may reference images not displayed]

FINDINGS: Lower chest: The lung bases are clear of acute process. No pleural
effusion or pulmonary lesions. The heart is normal in size. No
pericardial effusion. The distal esophagus and aorta are
unremarkable.

Hepatobiliary: No focal hepatic lesions or intrahepatic biliary
dilatation. The gallbladder is normal. No common bile duct
dilatation.

Pancreas: No mass, inflammation or ductal dilatation.

Spleen: Normal size.  No focal lesions.

Adrenals/Urinary Tract: The adrenal glands and kidneys are
unremarkable. No renal, ureteral or bladder calculi or mass.

Stomach/Bowel: The stomach, duodenum, small bowel and colon are
grossly normal without oral contrast. No inflammatory changes, mass
lesions or obstructive findings. The appendix is normal.

Vascular/Lymphatic: The aorta is normal in caliber. No dissection.
The branch vessels are patent. The major venous structures are
patent. No mesenteric or retroperitoneal mass or adenopathy. Small
scattered lymph nodes are noted.

Reproductive: The prostate gland and seminal vesicles are
unremarkable.

Other: No pelvic mass or adenopathy. No free pelvic fluid
collections. No inguinal mass or adenopathy. No abdominal wall
hernia or subcutaneous lesions.

Musculoskeletal: No significant bony findings. Moderate lumbar spine
degenerative changes for the patient's age. There is a unilateral
pars defect noted on the right side at L5.
IMPRESSION: 1. No acute abdominal/pelvic findings, mass lesions or adenopathy.
2. No renal, ureteral or bladder calculi or mass.

## 2020-10-05 MED ORDER — IOHEXOL 300 MG/ML  SOLN
125.0000 mL | Freq: Once | INTRAMUSCULAR | Status: AC | PRN
  Administered 2020-10-05: 125 mL via INTRAVENOUS
  Filled 2020-10-05: qty 125

## 2020-10-05 MED ORDER — DICYCLOMINE HCL 10 MG PO CAPS: 10.0000 mg | ORAL_CAPSULE | Freq: Four times a day (QID) | ORAL | 0 refills | Status: DC

## 2020-10-05 NOTE — ED Provider Notes (Signed)
[facility] Emergency Department Provider Note  ____________________________________________   Event Date/Time   First MD Initiated Contact with Patient 10/05/20 1103     (approximate)  I have reviewed the triage vital signs and the nursing notes.   HISTORY  Chief Complaint Flank Pain    HPI Melvin Hill is a 40 y.o. male Melvin Hill with intermittent left flank pain and intermittent left lower quadrant pain for over a months but increased over the last 3 days.  Patient states he was working in [place] at the [employer] and drive back from [facility].  No chest pain or shortness of breath.  Patient states that he will have intermittent episodes of constipation and diarrhea.  Has had some pale stools but no dark tarry stools.  Patient does still have his gallbladder and appendix.  No family history of Crohn's or ulcerative colitis, or colon cancer.    Past Medical History:  Diagnosis Date  . History of chicken pox   . Obesity     Patient Active Problem List   Diagnosis Date Noted  . Gastroesophageal reflux disease 03/09/2020  . Right bundle branch block (RBBB) on electrocardiogram (ECG) 03/09/2020  . Heartburn 02/25/2020  . Atypical chest pain 02/25/2020  . Viral illness 06/01/2015  . Elbow pain 10/01/2014  . Hand pain 10/01/2014  . Finger mass, left 10/01/2014  . Transaminitis 05/16/2011  . Lower back pain 05/03/2011  . Class 2 obesity due to excess calories without serious comorbidity with body mass index (BMI) of 37.0 to 37.9 in adult     Past Surgical History:  Procedure Laterality Date  . ANTERIOR CRUCIATE LIGAMENT REPAIR  2001   Left knee    Prior to Admission medications   Medication Sig Start Date End Date Taking? Authorizing Provider  dicyclomine (BENTYL) 10 MG capsule Take 1 capsule (10 mg total) by mouth 4 (four) times daily for 14 days. 10/05/20 10/19/20 Yes Fisher, Roselyn Bering, PA-C    Allergies Patient has no known  allergies.  Family History  Problem Relation Age of Onset  . Diabetes Father   . Diabetes Maternal Grandfather   . Stroke Maternal Grandfather   . Diabetes Maternal Grandmother   . Coronary artery disease Maternal Grandmother   . Cancer Neg Hx   . Hypertension Neg Hx     Social History Social History   Tobacco Use  . Smoking status: Never Smoker  . Smokeless tobacco: Never Used  Substance Use Topics  . Alcohol use: Yes    Comment: Very rare  . Drug use: No    Review of Systems  Constitutional: No fever/chills Eyes: No visual changes. ENT: No sore throat. Respiratory: Denies cough Cardiovascular: Denies chest pain Gastrointestinal: Positive abdominal pain Genitourinary: Negative for dysuria. Musculoskeletal: Negative for back pain. Skin: Negative for rash. Psychiatric: no mood changes,     ____________________________________________   PHYSICAL EXAM:  VITAL SIGNS: ED Triage Vitals  Enc Vitals Group     BP 10/05/20 1031 (!) 150/83     Pulse Rate 10/05/20 1031 79     Resp 10/05/20 1031 18     Temp 10/05/20 1031 97.7 F (36.5 C)     Temp Source 10/05/20 1031 Oral     SpO2 10/05/20 1031 100 %     Weight 10/05/20 1030 (!) 310 lb (140.6 kg)     Height 10/05/20 1030 6\' 4"  (1.93 m)     Head Circumference --      Peak Flow --  Pain Score 10/05/20 1048 2     Pain Loc --      Pain Edu? --      Excl. in GC? --     Constitutional: Alert and oriented. Well appearing and in no acute distress. Eyes: Conjunctivae are normal.  Head: Atraumatic. Nose: No congestion/rhinnorhea. Mouth/Throat: Mucous membranes are moist.   Neck:  supple no lymphadenopathy noted Cardiovascular: Normal rate, regular rhythm. Heart sounds are normal Respiratory: Normal respiratory effort.  No retractions, lungs c t a  Abd: soft nontender bs normal all 4 quad GU: deferred Rectal: No hemorrhoids noted, no rectal prolapse noted, prostate does appear to be a little  swollen Musculoskeletal: FROM all extremities, warm and well perfused Neurologic:  Normal speech and language.  Skin:  Skin is warm, dry and intact. No rash noted. Psychiatric: Mood and affect are normal. Speech and behavior are normal.  ____________________________________________   LABS (all labs ordered are listed, but only abnormal results are displayed)  Labs Reviewed  COMPREHENSIVE METABOLIC PANEL - Abnormal; Notable for the following components:      Result Value   Glucose, Bld 142 (*)    All other components within normal limits  URINALYSIS, COMPLETE (UACMP) WITH MICROSCOPIC - Abnormal; Notable for the following components:   Color, Urine YELLOW (*)    APPearance CLEAR (*)    All other components within normal limits  LIPASE, BLOOD  CBC   ____________________________________________   ____________________________________________  RADIOLOGY  CT abdomen/pelvis with IV contrast  ____________________________________________   PROCEDURES  Procedure(s) performed: No  Procedures    ____________________________________________   INITIAL IMPRESSION / ASSESSMENT AND PLAN / ED COURSE  Pertinent labs & imaging results that were available during my care of the patient were reviewed by me and considered in my medical decision making (see chart for details).   Patient is 40 year old male presents abdominal pain.  See HPI.  Physical exam shows patient appears stable  DDx: Acute cholecystitis, acute: Pancreatitis, kidney stone, diverticulitis, IBS, mass  Labs are reassuring, CBC, comprehensive metabolic panel, lipase and urinalysis are all normal  CT abdomen/pelvis with IV contrast    CT abdomen/pelvis negative.  Explained to the patient that he had mostly sounds like irritable bowel disease.  We will give a trial of Bentyl.  He is to follow-up with GI, Dr. Mia Creek.  Call for an appointment.  Return emergency department worsening.  Discharged stable  condition.  Melvin Hill was evaluated in Emergency Department on 10/05/2020 for the symptoms described in the history of present illness. He was evaluated in the context of the global COVID-19 pandemic, which necessitated consideration that the patient might be at risk for infection with the SARS-CoV-2 virus that causes COVID-19. Institutional protocols and algorithms that pertain to the evaluation of patients at risk for COVID-19 are in a state of rapid change based on information released by regulatory bodies including the CDC and federal and state organizations. These policies and algorithms were followed during the patient's care in the ED.    As part of my medical decision making, I reviewed the following data within the electronic MEDICAL RECORD NUMBER Nursing notes reviewed and incorporated, Labs reviewed , Old chart reviewed, Radiograph reviewed , Notes from prior ED visits and [place] Controlled Substance Database  ____________________________________________   FINAL CLINICAL IMPRESSION(S) / ED DIAGNOSES  Final diagnoses:  Irritable bowel syndrome, unspecified type      NEW MEDICATIONS STARTED DURING THIS VISIT:  New Prescriptions   DICYCLOMINE (BENTYL) 10 MG  CAPSULE    Take 1 capsule (10 mg total) by mouth 4 (four) times daily for 14 days.     Note:  This document was prepared using Dragon voice recognition software and may include unintentional dictation errors.    Faythe Ghee, PA-C 10/05/20 1247    Jene Every, MD 10/05/20 1329

## 2020-10-05 NOTE — ED Triage Notes (Signed)
Patient to ER for c/o left intermittent flank pain with intermittent LLQ pain. Patient reports driving back from [place] last night and noticed pain to left flank was more severe. Patient also reports new pressure around rectum/scrotal area, spasm sensation. Patient reports intermittent constipation and diarrhea. Had normal BM this am.

## 2020-10-05 NOTE — ED Notes (Signed)
See triage note  Presents with pain to left upper/mid abd which moves to lateral rib area   States he has had this pain for about 4 weeks  No fever  But has had intermittent diarrhea and constipation

## 2020-10-05 NOTE — Discharge Instructions (Signed)
Follow-up with Dr. Mia Creek.  Please call for an appointment.  Your labs and CT did not show any reason for your abdominal pain.  This could be due to irritable bowel syndrome.  I did give you a prescription for Bentyl that helps with stomach discomfort and cramps. Return emergency department worsening

## 2020-10-16 ENCOUNTER — Ambulatory Visit: Payer: 59

## 2020-10-22 ENCOUNTER — Ambulatory Visit
Admission: EM | Admit: 2020-10-22 | Discharge: 2020-10-22 | Disposition: A | Discharge status: Home / Self Care | Payer: 59 | Attending: Family Medicine | Admitting: Family Medicine

## 2020-10-22 DIAGNOSIS — R350 Frequency of micturition: Secondary | ICD-10-CM

## 2020-10-22 LAB — POCT URINALYSIS DIP (MANUAL ENTRY)
Bilirubin, UA: NEGATIVE
Blood, UA: NEGATIVE
Glucose, UA: NEGATIVE mg/dL
Ketones, POC UA: NEGATIVE mg/dL
Leukocytes, UA: NEGATIVE
Nitrite, UA: NEGATIVE
Protein Ur, POC: NEGATIVE mg/dL
Spec Grav, UA: 1.025 (ref 1.010–1.025)
Urobilinogen, UA: 0.2 E.U./dL
pH, UA: 7 (ref 5.0–8.0)

## 2020-10-22 LAB — POCT FASTING CBG KUC MANUAL ENTRY: POCT Glucose (KUC): 102 mg/dL — AB (ref 70–99)

## 2020-10-22 NOTE — Discharge Instructions (Signed)
Your blood sugar here was 102. Around the same as in your chart from previous visits.

## 2020-10-22 NOTE — ED Triage Notes (Signed)
Pt c/o urinary frequency with a tingle when urinating x2 days. States has a pressure feeling at times behind testicle. States has had a prostate exam and x-rays 03/14.

## 2020-10-22 NOTE — ED Provider Notes (Signed)
[facility]   [id number] 10/22/20 Arrival Time: 1222  ASSESSMENT & PLAN:  1. Urinary frequency     Unclear etiology. Vague symptoms. U/A normal today. CBG 101. Declines STI testing. Normal testicular exam.  Recommend urology evaluation.   Follow-up Information    Schedule an appointment as soon as possible for a visit  with [facility].   Contact information: [address] [place] [address] [phone]              Reviewed expectations re: course of current medical issues. Questions answered. Outlined signs and symptoms indicating need for more acute intervention. Understanding verbalized. After Visit Summary given.   SUBJECTIVE: History from: patient. Melvin Hill is a 40 y.o. male who reports slight urinary freq with "a tingling sensation" of tip of penis. No d/c. "Sometimes a pressure feeling behind testicles". No testicular or scrotal swelling. No gross hematuria. Sexually active with wife only. Seen in ED on 10/05/2020. Notes and imaging reviewed today.  OBJECTIVE:  Vitals:   10/22/20 1234  BP: (!) 142/85  Pulse: 72  Resp: 18  Temp: 97.8 F (36.6 C)  TempSrc: Oral  SpO2: 96%    General appearance: alert; no distress Neck: supple  Lungs: speaks full sentences without difficulty; unlabored Extremities: no edema Abd: soft GU: normal appearing testicles without swelling or masses; no epididymal tenderness Skin: warm and dry Neurologic: normal gait Psychological: alert and cooperative; normal mood and affect  Labs: Results for orders placed or performed during the hospital encounter of 10/22/20  POCT urinalysis dipstick  Result Value Ref Range   Color, UA yellow yellow   Clarity, UA clear clear   Glucose, UA negative negative mg/dL   Bilirubin, UA negative negative   Ketones, POC UA negative negative mg/dL   Spec Grav, UA 2.993 7.169 - 1.025   Blood, UA negative negative   pH, UA 7.0 5.0 - 8.0    Protein Ur, POC negative negative mg/dL   Urobilinogen, UA 0.2 0.2 or 1.0 E.U./dL   Nitrite, UA Negative Negative   Leukocytes, UA Negative Negative  POCT CBG (manual entry)  Result Value Ref Range   POCT Glucose ([facility]) 102 (A) 70 - 99 mg/dL     No Known Allergies  Past Medical History:  Diagnosis Date  . History of chicken pox   . Obesity    Social History   Socioeconomic History  . Marital status: Married    Spouse name: Not on file  . Number of children: 0  . Years of education: Not on file  . Highest education level: Not on file  Occupational History  . Occupation: Careers information officer: UNEMPLOYED  Tobacco Use  . Smoking status: Never Smoker  . Smokeless tobacco: Never Used  Substance and Sexual Activity  . Alcohol use: Yes    Comment: Very rare  . Drug use: No  . Sexual activity: Not on file  Other Topics Concern  . Not on file  Social History Narrative   Caffeine: some soda, tea 2-3 cups/day   Lives with fiancee, 1 dog   Was football lineman during college   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   Social Determinants of Health   Financial Resource Strain: Not on file  Food Insecurity: Not on file  Transportation Needs: Not  on file  Physical Activity: Not on file  Stress: Not on file  Social Connections: Not on file  Intimate Partner Violence: Not on file   Family History  Problem Relation Age of Onset  . Diabetes Father   . Diabetes Maternal Grandfather   . Stroke Maternal Grandfather   . Diabetes Maternal Grandmother   . Coronary artery disease Maternal Grandmother   . Cancer Neg Hx   . Hypertension Neg Hx    Past Surgical History:  Procedure Laterality Date  . ANTERIOR CRUCIATE LIGAMENT REPAIR  2001   Left knee     Mardella Layman, MD 10/22/20 1312

## 2020-10-27 ENCOUNTER — Telehealth: Payer: Self-pay | Admitting: Internal Medicine

## 2020-10-27 NOTE — Telephone Encounter (Signed)
Patient called.  Patient was mailed a no show charge for d.o.s. 09/11/20.  Patient said he called and cancelled the appointment because his son had croup. The person he talked to sounded like it would be taken care of the cancellation.  Can no show fee be waived?

## 2020-11-02 NOTE — Telephone Encounter (Signed)
I called and discussed with patient. I had the no show fee waived as a one time courtesy as this does not seem to be a reoccurring issue.

## 2020-11-18 ENCOUNTER — Encounter: Payer: Self-pay | Admitting: Internal Medicine

## 2020-11-18 ENCOUNTER — Other Ambulatory Visit: Payer: Self-pay

## 2020-11-18 ENCOUNTER — Ambulatory Visit: Payer: 59 | Admitting: Internal Medicine

## 2020-11-18 DIAGNOSIS — G629 Polyneuropathy, unspecified: Secondary | ICD-10-CM | POA: Insufficient documentation

## 2020-11-18 DIAGNOSIS — R200 Anesthesia of skin: Secondary | ICD-10-CM

## 2020-11-18 LAB — TSH: TSH: 1.52 u[IU]/mL (ref 0.35–4.50)

## 2020-11-18 LAB — VITAMIN B12: Vitamin B-12: 306 pg/mL (ref 211–911)

## 2020-11-18 LAB — HEMOGLOBIN A1C: Hgb A1c MFr Bld: 5.4 % (ref 4.6–6.5)

## 2020-11-18 NOTE — Assessment & Plan Note (Signed)
Likely mechanical Will check A1c, B12, thyroid Other labs recently fine

## 2020-11-18 NOTE — Progress Notes (Signed)
Subjective:    Patient ID: Melvin Hill, male    DOB: 1980/10/17, 40 y.o.   MRN: [id number]  HPI  Here due to sensation changes in his feet This visit occurred during the SARS-CoV-2 public health emergency.  Safety protocols were in place, including screening questions prior to the visit, additional usage of staff PPE, and extensive cleaning of exam room while observing appropriate contact time as indicated for disinfecting solutions.   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 sucralfate (CARAFATE) 1 g tablet Take 1 tablet by mouth 2 (two) times daily before a meal.     No current facility-administered medications on file prior to visit.    No Known Allergies  Past Medical History:  Diagnosis Date  . History of chicken pox   . Obesity     Past Surgical History:  Procedure Laterality Date  . ANTERIOR CRUCIATE LIGAMENT REPAIR  2001   Left knee    Family History  Problem Relation Age of Onset  . Diabetes Father   . Diabetes Maternal Grandfather   . Stroke Maternal Grandfather   . Diabetes Maternal Grandmother   . Coronary artery disease Maternal Grandmother   . Cancer Neg Hx   . Hypertension Neg Hx     Social History   Socioeconomic History  . Marital status: Married    Spouse name: Not on file  . Number of children: 0  . Years of education: Not on file  .  Highest education level: Not on file  Occupational History  . Occupation: Careers information officer: UNEMPLOYED  Tobacco Use  . Smoking status: Never Smoker  . Smokeless tobacco: Never Used  Substance and Sexual Activity  . Alcohol use: Yes    Comment: Very rare  . Drug use: No  . Sexual activity: Not on file  Other Topics Concern  . Not on file  Social History Narrative   Caffeine: some soda, tea 2-3 cups/day   Lives with fiancee, 1 dog   Was football lineman during college   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   Social Determinants of Health   Financial Resource Strain: Not on file  Food Insecurity: Not on file  Transportation Needs: Not on file  Physical Activity: Not on file  Stress: Not on file  Social Connections: Not on file  Intimate Partner Violence: Not on file   Review of Systems No recent illness No increase thirst or urination Weight is going down some--having some GI symptoms and EGD is pending (now on sucralfate) Has checked sugars at work---89 two hours after eating; also 89 fasting    Objective:   Physical Exam Musculoskeletal:     Right lower leg: No edema.  Left lower leg: No edema.  Skin:    Comments: Plantar feet with callouses--but no other lesions  Neurological:     Comments: Normal sensation on plantar feet            Assessment & Plan:

## 2020-11-19 ENCOUNTER — Encounter: Payer: 59 | Admitting: Internal Medicine

## 2020-11-20 DIAGNOSIS — R200 Anesthesia of skin: Secondary | ICD-10-CM

## 2020-11-20 LAB — PROTEIN ELECTROPHORESIS, SERUM, WITH REFLEX
Albumin ELP: 4.5 g/dL (ref 3.8–4.8)
Alpha 1: 0.2 g/dL (ref 0.2–0.3)
Alpha 2: 0.6 g/dL (ref 0.5–0.9)
Beta 2: 0.3 g/dL (ref 0.2–0.5)
Beta Globulin: 0.4 g/dL (ref 0.4–0.6)
Gamma Globulin: 0.9 g/dL (ref 0.8–1.7)
Total Protein: 7 g/dL (ref 6.1–8.1)

## 2020-12-27 ENCOUNTER — Emergency Department
Admission: EM | Admit: 2020-12-27 | Discharge: 2020-12-27 | Disposition: A | Discharge status: Home / Self Care | Payer: 59 | Attending: Emergency Medicine | Admitting: Emergency Medicine

## 2020-12-27 ENCOUNTER — Other Ambulatory Visit: Payer: Self-pay

## 2020-12-27 ENCOUNTER — Encounter: Payer: Self-pay | Admitting: Emergency Medicine

## 2020-12-27 DIAGNOSIS — I10 Essential (primary) hypertension: Secondary | ICD-10-CM

## 2020-12-27 DIAGNOSIS — Z79899 Other long term (current) drug therapy: Secondary | ICD-10-CM | POA: Insufficient documentation

## 2020-12-27 DIAGNOSIS — R202 Paresthesia of skin: Secondary | ICD-10-CM | POA: Insufficient documentation

## 2020-12-27 DIAGNOSIS — R1012 Left upper quadrant pain: Secondary | ICD-10-CM

## 2020-12-27 DIAGNOSIS — R200 Anesthesia of skin: Secondary | ICD-10-CM

## 2020-12-27 LAB — URINALYSIS, COMPLETE (UACMP) WITH MICROSCOPIC
Bacteria, UA: NONE SEEN
Bilirubin Urine: NEGATIVE
Glucose, UA: NEGATIVE mg/dL
Hgb urine dipstick: NEGATIVE
Ketones, ur: NEGATIVE mg/dL
Leukocytes,Ua: NEGATIVE
Nitrite: NEGATIVE
Protein, ur: NEGATIVE mg/dL
Specific Gravity, Urine: 1.015 (ref 1.005–1.030)
Squamous Epithelial / HPF: NONE SEEN (ref 0–5)
pH: 5 (ref 5.0–8.0)

## 2020-12-27 LAB — COMPREHENSIVE METABOLIC PANEL
ALT: 21 U/L (ref 0–44)
AST: 21 U/L (ref 15–41)
Albumin: 4.4 g/dL (ref 3.5–5.0)
Alkaline Phosphatase: 49 U/L (ref 38–126)
Anion gap: 8 (ref 5–15)
BUN: 16 mg/dL (ref 6–20)
CO2: 25 mmol/L (ref 22–32)
Calcium: 9 mg/dL (ref 8.9–10.3)
Chloride: 105 mmol/L (ref 98–111)
Creatinine, Ser: 0.9 mg/dL (ref 0.61–1.24)
GFR, Estimated: >60 mL/min (ref >60)
Glucose, Bld: 116 mg/dL — ABNORMAL HIGH (ref 70–99)
Potassium: 3.8 mmol/L (ref 3.5–5.1)
Sodium: 138 mmol/L (ref 135–145)
Total Bilirubin: 0.8 mg/dL (ref 0.3–1.2)
Total Protein: 7.4 g/dL (ref 6.5–8.1)

## 2020-12-27 LAB — CBC
HCT: 43.6 % (ref 39.0–52.0)
Hemoglobin: 15.3 g/dL (ref 13.0–17.0)
MCH: 29.9 pg (ref 26.0–34.0)
MCHC: 35.1 g/dL (ref 30.0–36.0)
MCV: 85.2 fL (ref 80.0–100.0)
Platelets: 248 10*3/uL (ref 150–400)
RBC: 5.12 MIL/uL (ref 4.22–5.81)
RDW: 12.2 % (ref 11.5–15.5)
WBC: 6.9 10*3/uL (ref 4.0–10.5)
nRBC: 0 % (ref 0.0–0.2)

## 2020-12-27 LAB — LIPASE, BLOOD: Lipase: 27 U/L (ref 11–51)

## 2020-12-27 LAB — TROPONIN I (HIGH SENSITIVITY): Troponin I (High Sensitivity): 4 ng/L (ref <18)

## 2020-12-27 MED ORDER — AMLODIPINE BESYLATE 5 MG PO TABS: 5.0000 mg | ORAL_TABLET | Freq: Every day | ORAL | 11 refills | Status: DC

## 2020-12-27 NOTE — ED Triage Notes (Signed)
Pt in w/HTN - states SBP 170 PTA. No hx of HTN, but recent increase in life stressors. States he has been having some intermittent L foot numbness/tingling x few months - currently on Gabapentin for this. Also c/o some sharp intermittent LUQ/epigastric pain that radiates to L flank. Denies any unilateral weakness, does have hx of some back injuries, unknown specifics. Denies any HA or vision changes. Denies any cp, sob or n/v

## 2020-12-27 NOTE — ED Provider Notes (Signed)
[facility] Emergency Department Provider Note  ____________________________________________   Event Date/Time   First MD Initiated Contact with Patient 12/27/20 [phone]     (approximate)  I have reviewed the triage vital signs and the nursing notes.   HISTORY  Chief Complaint Hypertension and Abdominal Pain   HPI Melvin Hill is a 40 y.o. male with past medical history of obesity, GERD, right bundle branch block, chronic low back pain and some subacute to chronic numbness in his fingers and toes currently being followed by neurology having recently undergone EMG studies of based on the results of these and some subacute left upper quadrant pain currently followed by GI having recent undergoing endoscopy who presents for assessment with concerns about elevated blood pressure and that he is not sure what is causing his abdominal pain or numbness.  He states he has been under a lot of stress lately has not been very anxious and has taken his blood pressure several times and has been elevated with systolics in the 160s and 80s.  He denies any headache, vision changes 50 symptoms feels a little tightness in his neck.  He denies any current neck pain, chest pain, cough, shortness of breath, abdominal pain, vomiting, diarrhea, dysuria, rash or extremity symptoms.  No recent falls or injuries.  He states his abdominal pain left upper quadrant is often burning and worse after meals.  States he is currently on Protonix and Carafate and is waiting the results of H. pylori testing.  This pain is not any different today and tingling and numbness in his hands and feet are not any different today than it had been over last several weeks.  He denies any significant NSAID use, EtOH use illicit drug use.  He has never been diagnosed with high blood pressure.         Past Medical History:  Diagnosis Date  . History of chicken pox   . Obesity     Patient Active Problem List    Diagnosis Date Noted  . Sensory loss 11/18/2020  . Gastroesophageal reflux disease 03/09/2020  . Right bundle branch block (RBBB) on electrocardiogram (ECG) 03/09/2020  . Heartburn 02/25/2020  . Atypical chest pain 02/25/2020  . Viral illness 06/01/2015  . Elbow pain 10/01/2014  . Hand pain 10/01/2014  . Finger mass, left 10/01/2014  . Transaminitis 05/16/2011  . Lower back pain 05/03/2011  . Class 2 obesity due to excess calories without serious comorbidity with body mass index (BMI) of 37.0 to 37.9 in adult     Past Surgical History:  Procedure Laterality Date  . ANTERIOR CRUCIATE LIGAMENT REPAIR  2001   Left knee    Prior to Admission medications   Medication Sig Start Date End Date Taking? Authorizing Provider  amLODipine (NORVASC) 5 MG tablet Take 1 tablet (5 mg total) by mouth daily. 12/27/20 12/27/21 Yes Gilles Chiquito, MD  sucralfate (CARAFATE) 1 g tablet Take 1 tablet by mouth 2 (two) times daily before a meal. 11/05/20 11/05/21  [provider]    Allergies Patient has no known allergies.  Family History  Problem Relation Age of Onset  . Diabetes Father   . Diabetes Maternal Grandfather   . Stroke Maternal Grandfather   . Diabetes Maternal Grandmother   . Coronary artery disease Maternal Grandmother   . Cancer Neg Hx   . Hypertension Neg Hx     Social History Social History   Tobacco Use  . Smoking status: Never Smoker  .  Smokeless tobacco: Never Used  Substance Use Topics  . Alcohol use: Yes    Comment: Very rare  . Drug use: No    Review of Systems  Review of Systems  Constitutional: Negative for chills and fever.  HENT: Negative for sore throat.   Eyes: Negative for pain.  Respiratory: Negative for cough and stridor.   Cardiovascular: Negative for chest pain.  Gastrointestinal: Positive for abdominal pain. Negative for vomiting.  Genitourinary: Negative for dysuria.  Musculoskeletal: Negative for myalgias.  Skin: Negative for rash.   Neurological: Positive for tingling and sensory change. Negative for seizures, loss of consciousness and headaches.  Psychiatric/Behavioral: Negative for suicidal ideas. The patient is nervous/anxious.   All other systems reviewed and are negative.     ____________________________________________   PHYSICAL EXAM:  VITAL SIGNS: ED Triage Vitals  Enc Vitals Group     BP 12/27/20 0559 (!) 147/89     Pulse Rate 12/27/20 0559 75     Resp 12/27/20 0559 18     Temp 12/27/20 0559 97.7 F (36.5 C)     Temp Source 12/27/20 0559 Oral     SpO2 12/27/20 0559 99 %     Weight 12/27/20 0605 (!) 310 lb (140.6 kg)     Height --      Head Circumference --      Peak Flow --      Pain Score --      Pain Loc --      Pain Edu? --      Excl. in GC? --    Vitals:   12/27/20 0559  BP: (!) 147/89  Pulse: 75  Resp: 18  Temp: 97.7 F (36.5 C)  SpO2: 99%   Physical Exam Vitals and nursing note reviewed.  Constitutional:      Appearance: He is well-developed. He is obese.  HENT:     Head: Normocephalic and atraumatic.     Right Ear: External ear normal.     Left Ear: External ear normal.     Nose: Nose normal.     Mouth/Throat:     Mouth: Mucous membranes are moist.  Eyes:     Conjunctiva/sclera: Conjunctivae normal.  Cardiovascular:     Rate and Rhythm: Normal rate and regular rhythm.     Heart sounds: No murmur heard.   Pulmonary:     Effort: Pulmonary effort is normal. No respiratory distress.     Breath sounds: Normal breath sounds.  Abdominal:     Palpations: Abdomen is soft.     Tenderness: There is no abdominal tenderness.  Musculoskeletal:     Cervical back: Neck supple.  Skin:    General: Skin is warm and dry.     Capillary Refill: Capillary refill takes less than 2 seconds.  Neurological:     General: No focal deficit present.     Mental Status: He is alert and oriented to person, place, and time.  Psychiatric:        Mood and Affect: Mood normal.     Cranial  nerves II through XII grossly intact.  Patient has symmetric power in his upper and lower extremities.  2+ radial and DP pulses.  Sensation intact light touch all extremities.  No overlying skin changes erythema, warmth, edema, fluctuation or instability forearms hands upper arms or lower legs. ____________________________________________   LABS (all labs ordered are listed, but only abnormal results are displayed)  Labs Reviewed  COMPREHENSIVE METABOLIC PANEL - Abnormal; Notable for the following  components:      Result Value   Glucose, Bld 116 (*)    All other components within normal limits  URINALYSIS, COMPLETE (UACMP) WITH MICROSCOPIC - Abnormal; Notable for the following components:   Color, Urine YELLOW (*)    APPearance CLEAR (*)    All other components within normal limits  LIPASE, BLOOD  CBC  TROPONIN I (HIGH SENSITIVITY)   ____________________________________________  EKG  Sinus rhythm with a ventricular rate of 61, first-degree block with a parable of 212 and otherwise unremarkable intervals with nonspecific change in lead III and no other clearance of acute ischemia or significant Arrhythmia. ____________________________________________  RADIOLOGY  ED MD interpretation:   Official radiology report(s): No results found.  ____________________________________________   PROCEDURES  Procedure(s) performed (including Critical Care):  Procedures   ____________________________________________   INITIAL IMPRESSION / ASSESSMENT AND PLAN / ED COURSE     Patient presents with several concerns primarily for assessment of some anxiety and concerns that he has high blood pressure in the setting of some subacute to chronic left upper quadrant pain and extremity numbness currently being worked up by GI and neurology.  On arrival he is hypertensive with a BP of 147/89.  He otherwise has stable vital signs on room air.  He has a nonfocal neuro exam and ECG is reassuring  and given nonelevated troponin I have a low suspicionfor ACS.  CMP shows no significant electrode or metabolic derangements.  No evidence of hepatitis or cholestasis  With tenderness in the right upper quadrant Evalose patient for cholecystitis.  Lipase not consistent with pancreatitis.  Patient has no historical features to suggest SBO.  No right lower quadrant tenderness to suggest a appendicitis or ongoing pain fever or leukocytosis to suggest of diverticulitis.  In addition he has no CVA tenderness or findings on his UA to suggest pyelonephritis kidney stone.  Certainly possible patient has underlying undiagnosed high blood pressure and given he reports he has multiple hypertensive readings in the last couple days we will start on low-dose amlodipine.  It seems she is receiving appropriate outpatient work-up for his extremity numbness as he is seeing neurology for this and he is seeing GI for his left upper quadrant pain.  Given duration of symptoms with otherwise reassuring exam and work-up I have a low suspicion for other immediate life-threatening process at this time.    I think he is safe for discharge with plan for close outpatient GI PCP and neurology follow-up.  Also advised him to talk to a counselor at [facility] for help with his anxiety.       ____________________________________________   FINAL CLINICAL IMPRESSION(S) / ED DIAGNOSES  Final diagnoses:  Hypertension, unspecified type  Left upper quadrant abdominal pain  Extremity numbness    Medications - No data to display   ED Discharge Orders         Ordered    amLODipine (NORVASC) 5 MG tablet  Daily        12/27/20 3845           Note:  This document was prepared using Dragon voice recognition software and may include unintentional dictation errors.   Gilles Chiquito, MD 12/27/20 [phone]

## 2021-01-01 ENCOUNTER — Ambulatory Visit: Payer: 59 | Admitting: Family Medicine

## 2021-01-15 ENCOUNTER — Ambulatory Visit: Payer: 59 | Admitting: Internal Medicine

## 2021-01-15 ENCOUNTER — Other Ambulatory Visit: Payer: Self-pay

## 2021-01-15 ENCOUNTER — Encounter: Payer: Self-pay | Admitting: Internal Medicine

## 2021-01-15 DIAGNOSIS — K219 Gastro-esophageal reflux disease without esophagitis: Secondary | ICD-10-CM

## 2021-01-15 DIAGNOSIS — I10 Essential (primary) hypertension: Secondary | ICD-10-CM | POA: Diagnosis not present

## 2021-01-15 MED ORDER — AMLODIPINE BESYLATE 5 MG PO TABS: 5.0000 mg | ORAL_TABLET | Freq: Every day | ORAL | 3 refills | Status: DC

## 2021-01-15 NOTE — Assessment & Plan Note (Signed)
Symptoms better on the PPI

## 2021-01-15 NOTE — Assessment & Plan Note (Signed)
BP Readings from Last 3 Encounters:  01/15/21 112/88  12/27/20 129/81  11/18/20 136/84   Diagnosis in some question--but could be masked HTN (or just stress related) No problems with the amlodipine so will continue after discussion

## 2021-01-15 NOTE — Progress Notes (Signed)
Subjective:    Patient ID: Melvin Hill, male    DOB: 13-Jul-1981, 40 y.o.   MRN: [id number]  HPI Here for ER follow up This visit occurred during the SARS-CoV-2 public health emergency.  Safety protocols were in place, including screening questions prior to the visit, additional usage of staff PPE, and extensive cleaning of exam room while observing appropriate contact time as indicated for disinfecting solutions.   Had been checking BP--and had felt "off" the day before BP up as high as 160 systolic Various stressful issues Got himself worked up and felt worse Went to ER--- initially 140's/80's  Able to calm down some--and BP came down  Started on amlodipine at that visit 6/5 No problems with this med He thinks he "feels better" No dizziness or edema  Results from EGD--just gastritis Has calmed with the pantoprazole Trying to be careful with how he is eating  Current Outpatient Medications on File Prior to Visit  Medication Sig Dispense Refill   amLODipine (NORVASC) 5 MG tablet Take 1 tablet (5 mg total) by mouth daily. 30 tablet 11   pantoprazole (PROTONIX) 40 MG tablet Take 1 tablet by mouth 2 (two) times daily.     No current facility-administered medications on file prior to visit.    No Known Allergies  Past Medical History:  Diagnosis Date   History of chicken pox    Obesity     Past Surgical History:  Procedure Laterality Date   ANTERIOR CRUCIATE LIGAMENT REPAIR  2001   Left knee    Family History  Problem Relation Age of Onset   Diabetes Father    Diabetes Maternal Grandfather    Stroke Maternal Grandfather    Diabetes Maternal Grandmother    Coronary artery disease Maternal Grandmother    Cancer Neg Hx    Hypertension Neg Hx     Social History   Socioeconomic History   Marital status: Married    Spouse name: Not on file   Number of children: 0   Years of education: Not on file   Highest education level: Not on file  Occupational History    Occupation: IT sales professional  Tobacco Use   Smoking status: Never   Smokeless tobacco: Never  Substance and Sexual Activity   Alcohol use: Yes    Comment: Very rare   Drug use: No   Sexual activity: Not on file  Other Topics Concern   Not on file  Social History Narrative   Caffeine: some soda, tea 2-3 cups/day   Lives with fiancee, 1 dog   Was football lineman during college   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   Social Determinants of Health   Financial Resource Strain: Not on file  Food Insecurity: Not on file  Transportation Needs: Not on file  Physical Activity: Not on file  Stress: Not on file  Social Connections: Not on file  Intimate Partner Violence: Not on file   Review of Systems Still with some foot numbness Did see neurology--and NCV Will go back again    Objective:   Physical Exam Constitutional:      Appearance: Normal appearance.  Cardiovascular:     Rate and Rhythm: Normal rate and regular rhythm.     Heart sounds: No murmur heard.   No gallop.  Pulmonary:     Effort: Pulmonary  effort is normal.     Breath sounds: Normal breath sounds. No wheezing or rales.  Musculoskeletal:     Cervical back: Neck supple.     Right lower leg: No edema.     Left lower leg: No edema.  Lymphadenopathy:     Cervical: No cervical adenopathy.  Neurological:     Mental Status: He is alert.           Assessment & Plan:

## 2021-02-01 NOTE — Telephone Encounter (Signed)
Called the pt and made OV for tomorrow (02-02-21)

## 2021-02-02 ENCOUNTER — Encounter: Payer: Self-pay | Admitting: Internal Medicine

## 2021-02-02 ENCOUNTER — Other Ambulatory Visit: Payer: Self-pay

## 2021-02-02 ENCOUNTER — Ambulatory Visit: Payer: 59 | Admitting: Internal Medicine

## 2021-02-02 DIAGNOSIS — M546 Pain in thoracic spine: Secondary | ICD-10-CM | POA: Insufficient documentation

## 2021-02-02 MED ORDER — CYCLOBENZAPRINE HCL 10 MG PO TABS: 5.0000 mg | ORAL_TABLET | Freq: Every evening | ORAL | 0 refills | Status: DC | PRN

## 2021-02-02 NOTE — Assessment & Plan Note (Signed)
Could be referred GI pain (stomach or esophagus) since it started with that and is somewhat related. Exam does show specific area of sensitivity suggesting this could be muscular  He is changing to bid protonix for the GI symptoms Will try cyclobenzaprine at bedtime to see if that helps

## 2021-02-02 NOTE — Progress Notes (Signed)
Subjective:    Patient ID: Melvin Hill, male    DOB: 1981/07/21, 40 y.o.   MRN: [id number]  HPI Here due to back pain This visit occurred during the SARS-CoV-2 public health emergency.  Safety protocols were in place, including screening questions prior to the visit, additional usage of staff PPE, and extensive cleaning of exam room while observing appropriate contact time as indicated for disinfecting solutions.   Stomach pain has calmed down---but has ongoing back pain since the stomach problems started Will keep him up at night Doesn't feel like a muscle---with moving or stretching he can tell where it is but it doesn't change it  Feels like it in his stomach at times---and the back Or they can be separate Mostly now in back (medial to left flank)  Taking omeprazole bid and sucralfate Now changing to protonix bid  Current Outpatient Medications on File Prior to Visit  Medication Sig Dispense Refill   amLODipine (NORVASC) 5 MG tablet Take 1 tablet (5 mg total) by mouth daily. 90 tablet 3   pantoprazole (PROTONIX) 40 MG tablet Take 1 tablet by mouth 2 (two) times daily.     No current facility-administered medications on file prior to visit.    No Known Allergies  Past Medical History:  Diagnosis Date   History of chicken pox    Obesity     Past Surgical History:  Procedure Laterality Date   ANTERIOR CRUCIATE LIGAMENT REPAIR  2001   Left knee    Family History  Problem Relation Age of Onset   Diabetes Father    Diabetes Maternal Grandfather    Stroke Maternal Grandfather    Diabetes Maternal Grandmother    Coronary artery disease Maternal Grandmother    Cancer Neg Hx    Hypertension Neg Hx     Social History   Socioeconomic History   Marital status: Married    Spouse name: Not on file   Number of children: 0   Years of education: Not on file   Highest education level: Not on file  Occupational History   Occupation: IT sales professional  Tobacco Use    Smoking status: Never   Smokeless tobacco: Never  Substance and Sexual Activity   Alcohol use: Yes    Comment: Very rare   Drug use: No   Sexual activity: Not on file  Other Topics Concern   Not on file  Social History Narrative   Caffeine: some soda, tea 2-3 cups/day   Lives with fiancee, 1 dog   Was football lineman during college   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   Social Determinants of Health   Financial Resource Strain: Not on file  Food Insecurity: Not on file  Transportation Needs: Not on file  Physical Activity: Not on file  Stress: Not on file  Social Connections: Not on file  Intimate Partner Violence: Not on file   Review of Systems No N/V Eating okay     Objective:   Physical Exam Cardiovascular:     Rate and Rhythm: Normal rate and regular rhythm.     Heart sounds: No murmur heard.   No gallop.  Pulmonary:     Effort: Pulmonary effort is normal.     Breath sounds: Normal breath sounds. No wheezing or rales.  Abdominal:     Palpations: Abdomen is soft.  Tenderness: There is no abdominal tenderness.  Musculoskeletal:     Comments: No spine tenderness Does have some sensitivity along lower left ribs posteriorly           Assessment & Plan:

## 2021-02-04 ENCOUNTER — Other Ambulatory Visit: Payer: Self-pay | Admitting: Neurology

## 2021-02-04 DIAGNOSIS — G35 Multiple sclerosis: Secondary | ICD-10-CM

## 2021-02-09 ENCOUNTER — Ambulatory Visit
Admission: RE | Admit: 2021-02-09 | Discharge: 2021-02-09 | Disposition: A | Discharge status: Home / Self Care | Payer: 59 | Source: Ambulatory Visit | Attending: Neurology | Admitting: Neurology

## 2021-02-09 ENCOUNTER — Other Ambulatory Visit: Payer: Self-pay

## 2021-02-09 DIAGNOSIS — G35 Multiple sclerosis: Secondary | ICD-10-CM | POA: Diagnosis present

## 2021-02-09 IMAGING — MR MR HEAD W/O CM
12 series · 48 of 48 positions shown · non-contrast
Comparison: Head CT [DATE].

CLINICAL DATA: Multiple sclerosis. Additional history provided by
scanning technologist: Patient reports bilateral feet tingling for 2
months, worse when working outside.

EXAM:
MRI HEAD WITHOUT CONTRAST
TECHNIQUE: Multiplanar, multiecho pulse sequences of the brain and surrounding
structures were obtained without intravenous contrast.

[Series 5: ax dwi_tracew · axial · 3.0mm · 0.68mm/px · z∈[-77,+78]mm · 3 of 48 slices shown]
[im 1/48]
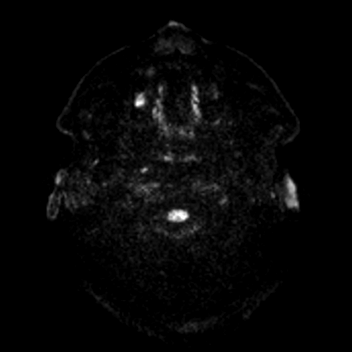
[im 24/48]
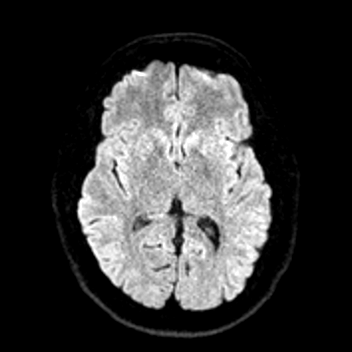
[im 48/48]
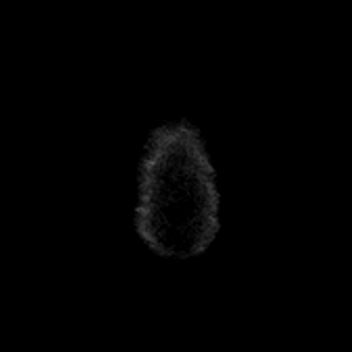

[Series 6: ax dwi_adc · axial · 3.0mm · 0.68mm/px · z∈[-77,+78]mm · 3 of 48 slices shown]
[im 1/48]
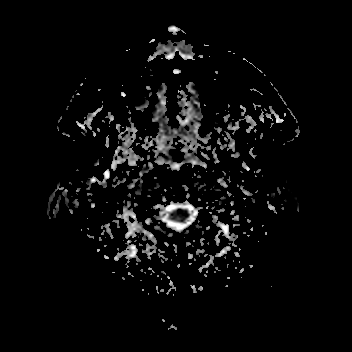
[im 24/48]
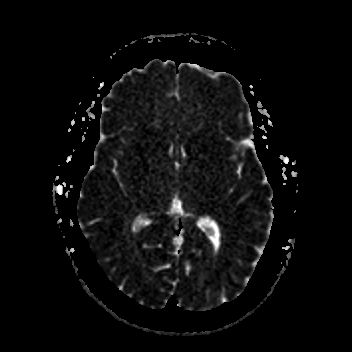
[im 48/48]
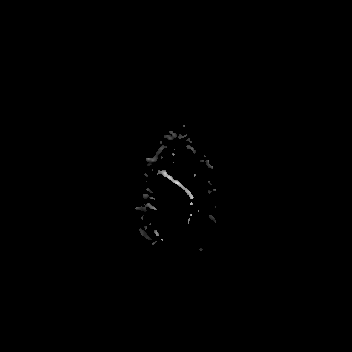

[Series 7: cor dwi_tracew · coronal · 5.0mm · 0.68mm/px · 3 of 44 slices shown]
[im 1/44]
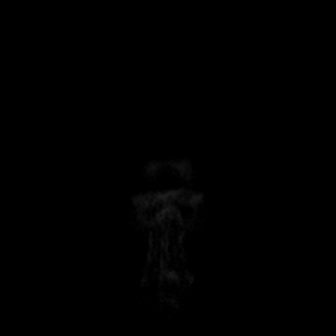
[im 22/44]
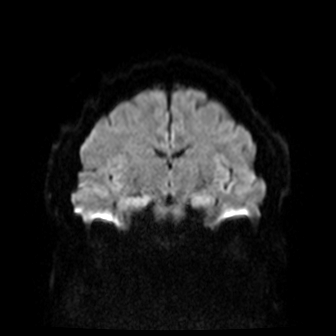
[im 44/44]
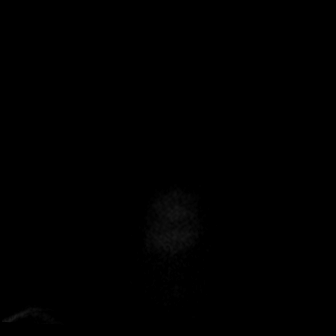

[Series 8: cor dwi_adc · coronal · 5.0mm · 0.68mm/px · 3 of 44 slices shown]
[im 1/44]
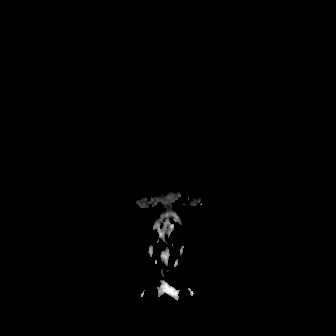
[im 22/44]
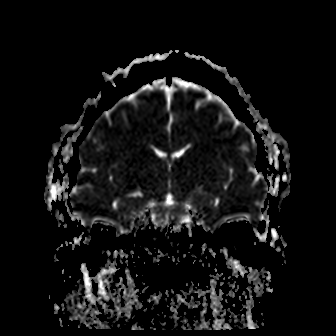
[im 44/44]
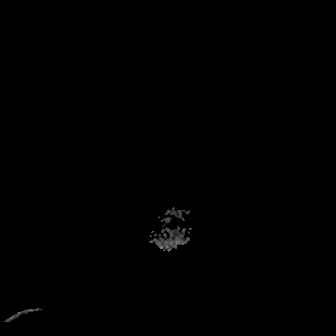

[Series 9: T1 · sagittal · 5.0mm · 0.62mm/px · 1 of 25 slices shown (1 of 2)]
[im 1/25]
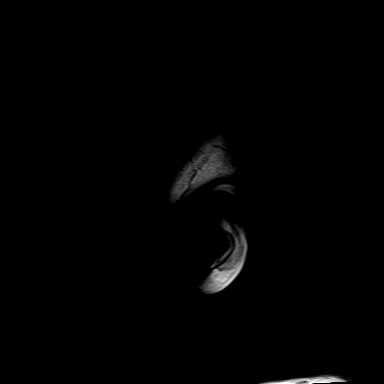

[Series 10: FLAIR · sagittal · 1.0mm · 0.98mm/px · 10 of 176 slices shown (1 of 2)]
[im 1/176]
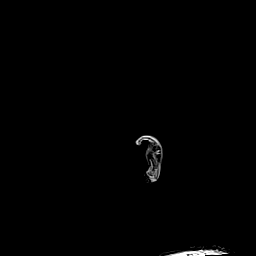
[im 20/176]
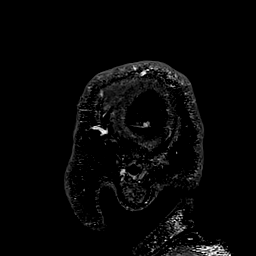
[im 39/176]
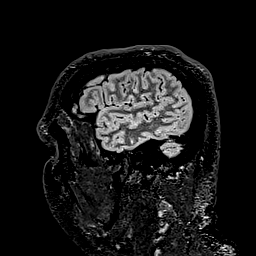
[im 59/176]
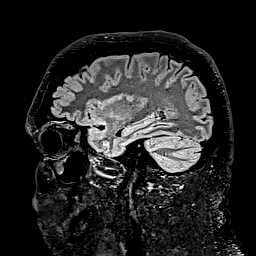
[im 78/176]
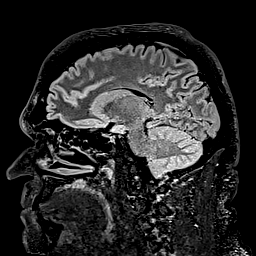
[im 98/176]
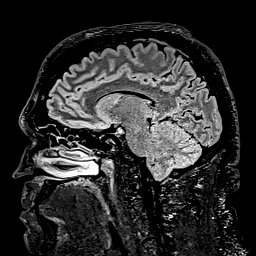
[im 117/176]
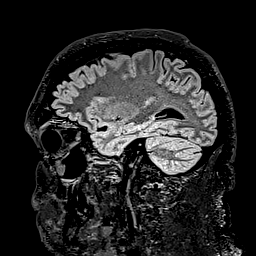
[im 137/176]
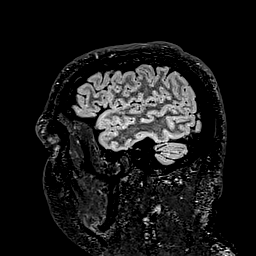
[im 156/176]
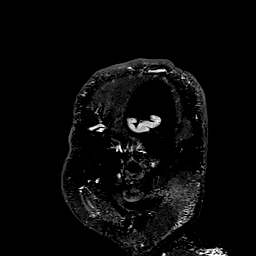
[im 176/176]
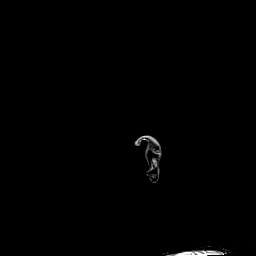

[Series 11: T2 · axial · 5.0mm · 0.58mm/px · z∈[-86,+82]mm · 2 of 29 slices shown (1 of 2)]
[im 1/29]
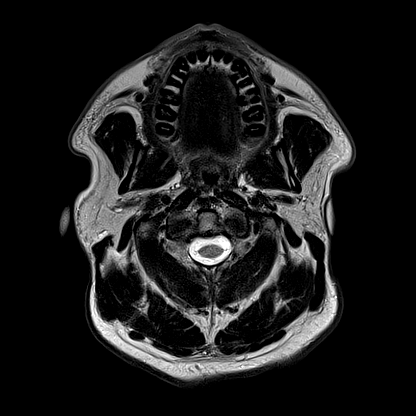
[im 29/29]
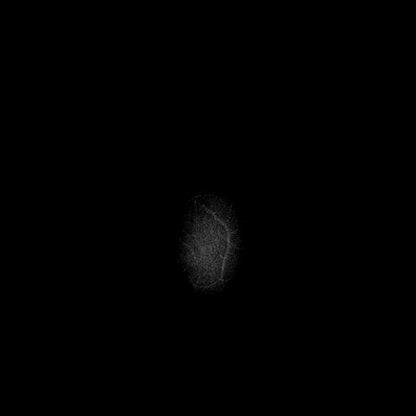

[Series 13: pha_images · axial · 3.0mm · 0.90mm/px · z∈[-90,+87]mm · 3 of 59 slices shown]
[im 1/59]
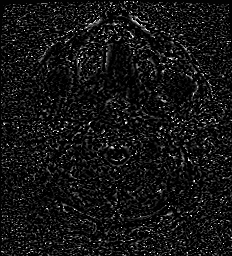
[im 30/59]
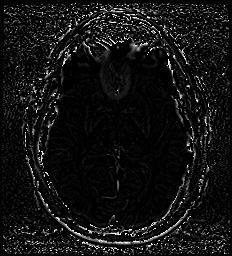
[im 59/59]
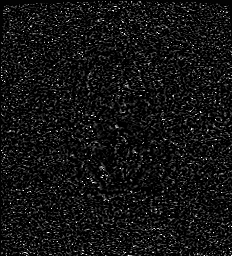

[Series 14: swi_images · axial · 3.0mm · 0.90mm/px · z∈[-90,+87]mm · 4 of 60 slices shown]
[im 1/60]
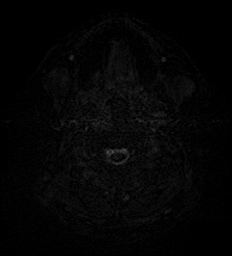
[im 20/60]
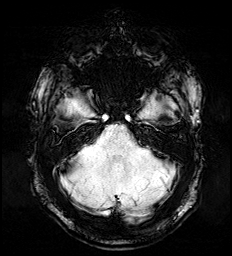
[im 40/60]
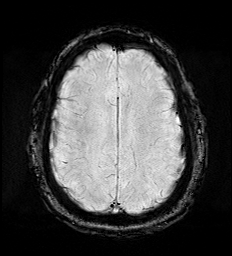
[im 60/60]
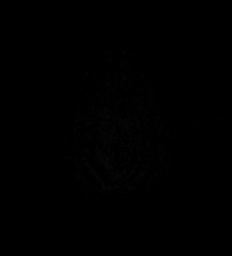

[Series 16: FLAIR · axial · 3.0mm · 0.53mm/px · z∈[-83,+79]mm · 3 of 55 slices shown (2 of 2)]
[im 1/55]
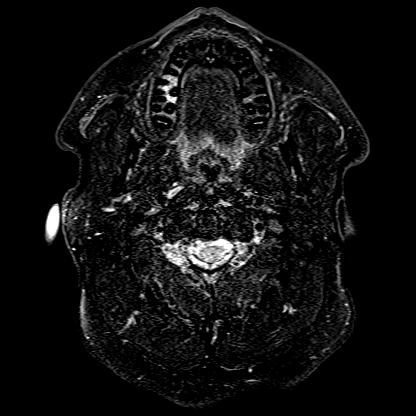
[im 28/55]
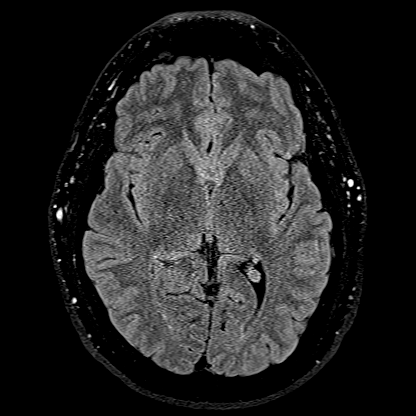
[im 55/55]
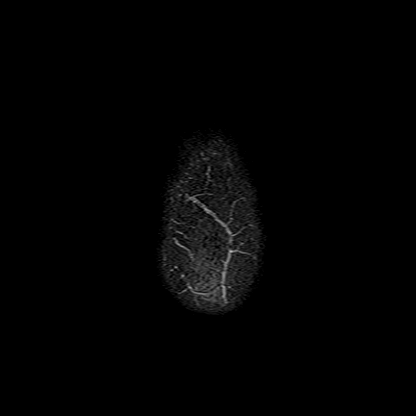

[Series 17: T1 · axial · 1.0mm · 0.98mm/px · z∈[-93,+98]mm · 11 of 190 slices shown (2 of 2)]
[im 1/190]
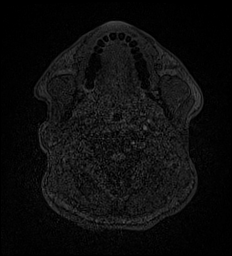
[im 19/190]
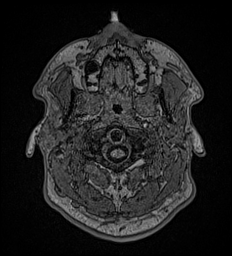
[im 38/190]
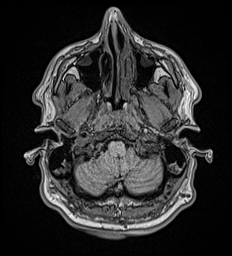
[im 57/190]
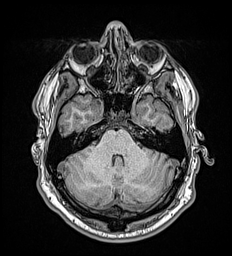
[im 76/190]
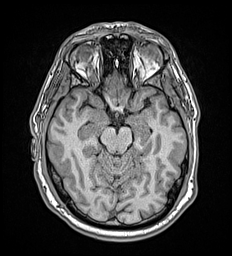
[im 95/190]
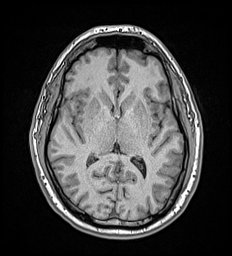
[im 114/190]
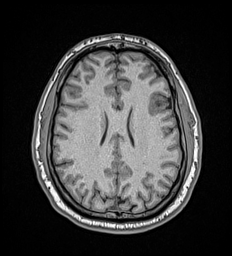
[im 133/190]
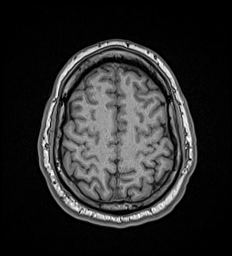
[im 152/190]
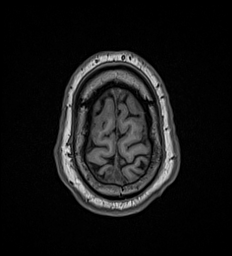
[im 171/190]
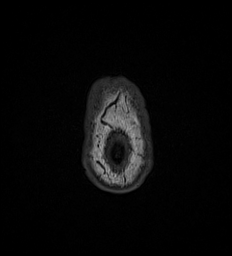
[im 190/190]
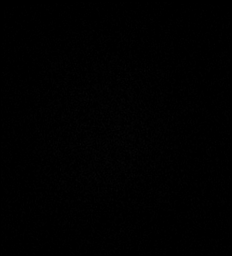

[Series 18: T2 · coronal · 5.0mm · 0.57mm/px · 2 of 33 slices shown (2 of 2)]
[im 1/33]
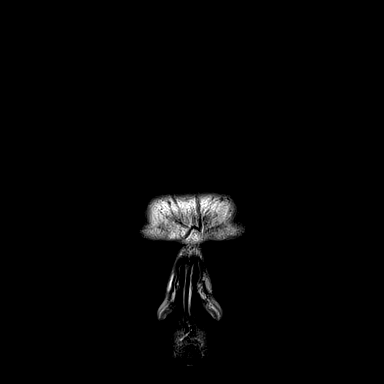
[im 33/33]
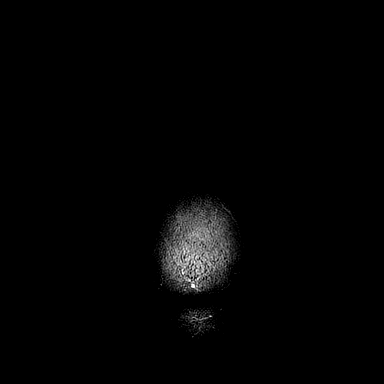

[48 of 48 positions shown; findings below may reference images not displayed]

FINDINGS: Brain:

Cerebral volume is normal.

The cerebellar tonsils extend up to 6 mm below the level of the
foramen magnum. This meets measurement criteria for a Chiari I
malformation. However, there is only slight crowding at the level of
the foramen magnum.

No cortical encephalomalacia is identified. No significant white
matter disease is identified to suggest multiple sclerosis.

There is no acute infarct.

No evidence of an intracranial mass.

No chronic intracranial blood products.

No extra-axial fluid collection.

No midline shift.

Vascular: Expected proximal arterial flow voids.

Skull and upper cervical spine: No focal suspicious marrow lesion.

Sinuses/Orbits: Visualized orbits show no acute finding. Mucous
retention cysts within the bilateral maxillary sinuses measuring up
to 14 mm. Mild background mucosal thickening also present within
both maxillary sinuses. Minimal bilateral ethmoid sinus mucosal
thickening.

Other: Trace fluid within the right mastoid air cells.
IMPRESSION: No evidence of acute intracranial abnormality.

No significant white matter disease suggest multiple sclerosis.

Chiari 1 malformation with only slight crowding at the level of the
foramen magnum.

Otherwise unremarkable non-contrast MRI appearance of the brain.

Paranasal sinus disease at the imaged levels, as described.

Trace fluid within the right mastoid air cells.

## 2021-03-04 DIAGNOSIS — F411 Generalized anxiety disorder: Secondary | ICD-10-CM | POA: Insufficient documentation

## 2021-06-09 ENCOUNTER — Other Ambulatory Visit: Payer: Self-pay | Admitting: Internal Medicine

## 2021-07-14 ENCOUNTER — Other Ambulatory Visit: Payer: Self-pay | Admitting: Neurology

## 2021-07-14 DIAGNOSIS — M5440 Lumbago with sciatica, unspecified side: Secondary | ICD-10-CM

## 2021-07-14 DIAGNOSIS — M5416 Radiculopathy, lumbar region: Secondary | ICD-10-CM

## 2021-07-14 DIAGNOSIS — R200 Anesthesia of skin: Secondary | ICD-10-CM

## 2021-08-15 ENCOUNTER — Other Ambulatory Visit: Payer: Self-pay

## 2021-08-15 ENCOUNTER — Ambulatory Visit
Admission: RE | Admit: 2021-08-15 | Discharge: 2021-08-15 | Disposition: A | Discharge status: Home / Self Care | Payer: 59 | Source: Ambulatory Visit | Attending: Neurology | Admitting: Neurology

## 2021-08-15 DIAGNOSIS — M5416 Radiculopathy, lumbar region: Secondary | ICD-10-CM

## 2021-08-15 DIAGNOSIS — M5440 Lumbago with sciatica, unspecified side: Secondary | ICD-10-CM

## 2021-08-15 DIAGNOSIS — R200 Anesthesia of skin: Secondary | ICD-10-CM

## 2021-08-15 DIAGNOSIS — R202 Paresthesia of skin: Secondary | ICD-10-CM

## 2021-08-15 IMAGING — MR MR LUMBAR SPINE W/O CM
4 of 5 series · 23 of 48 positions shown · non-contrast
Comparison: None available.

CLINICAL DATA: Initial evaluation for low back pain with radiation
into both lower extremities for 6 months.

EXAM:
MRI LUMBAR SPINE WITHOUT CONTRAST
TECHNIQUE: Multiplanar, multisequence MR imaging of the lumbar spine was
performed. No intravenous contrast was administered.

[Series 5: T2 · sagittal · 4.0mm · 0.94mm/px · 6 of 21 slices shown (1 of 2)]
[im 1/21]
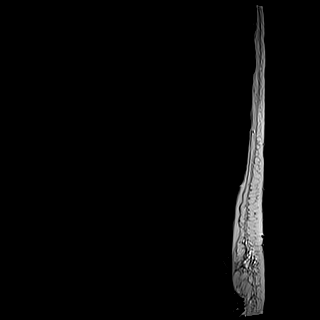
[im 5/21]
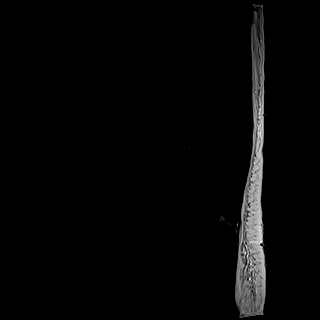
[im 9/21]
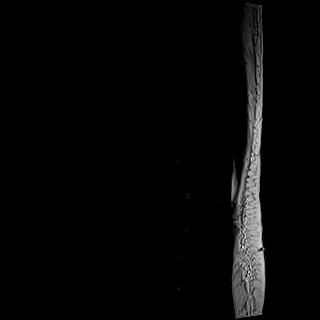
[im 13/21]
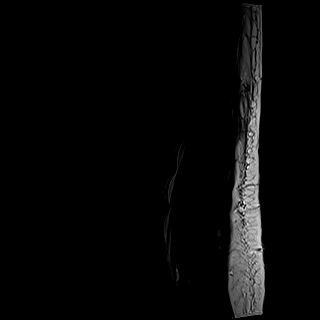
[im 17/21]
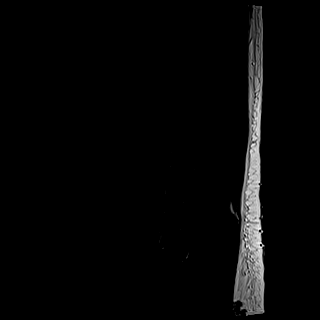
[im 21/21]
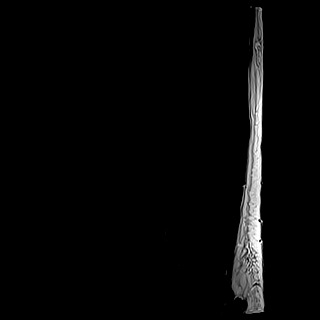

[Series 6: T1 · sagittal · 4.0mm · 0.94mm/px · 5 of 21 slices shown (1 of 2)]
[im 1/21]
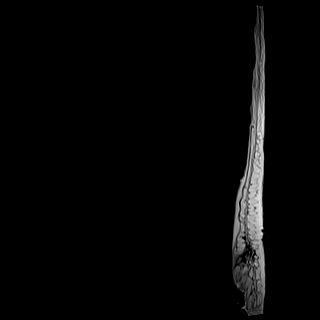
[im 5/21]
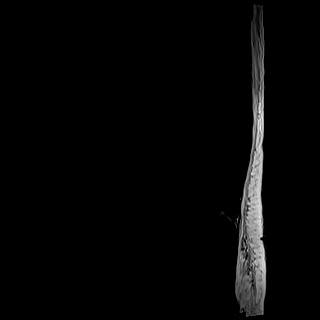
[im 9/21]
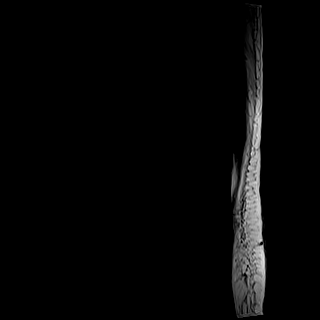
[im 13/21]
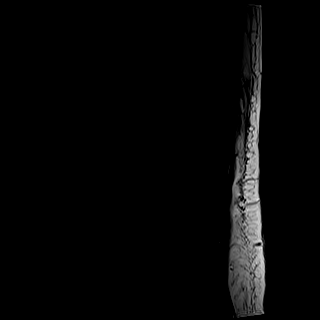
[im 21/21]
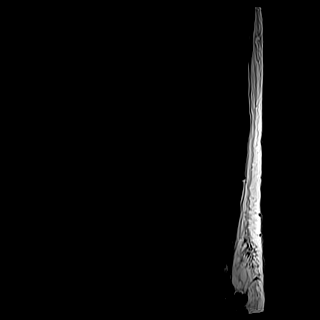

[Series 10: T2 · axial · 4.0mm · 0.39mm/px · z∈[-164,+88]mm · 9 of 51 slices shown (2 of 2)]
[im 1/51]
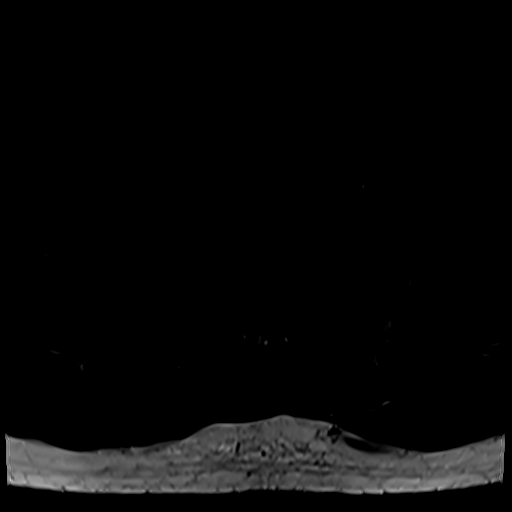
[im 8/51]
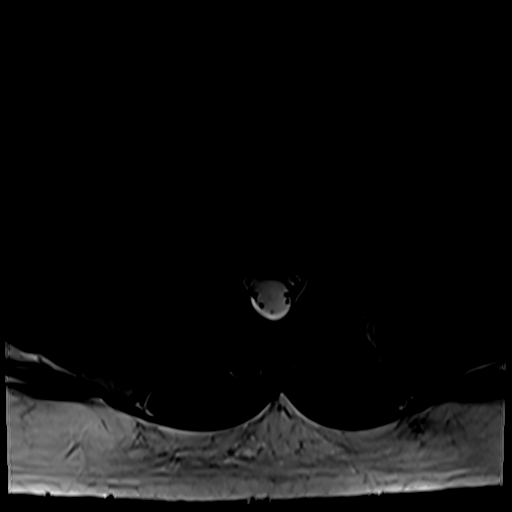
[im 15/51]
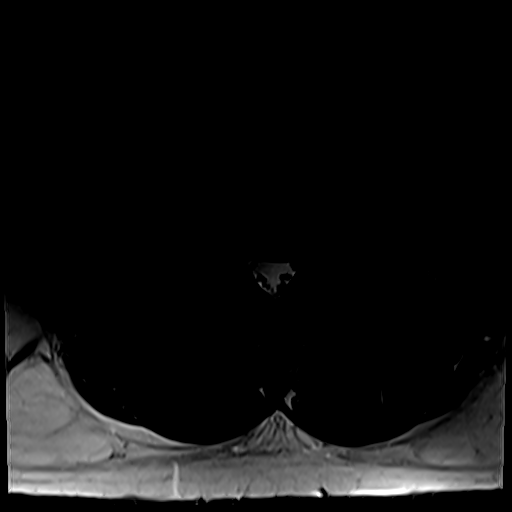
[im 22/51]
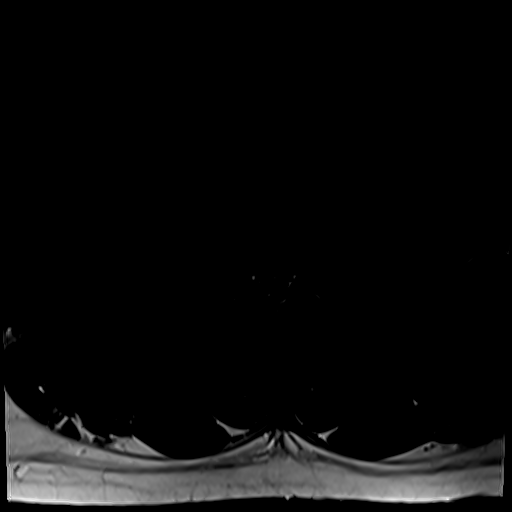
[im 26/51]
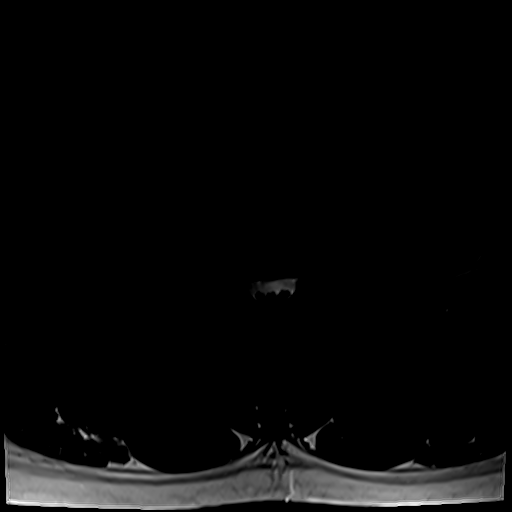
[im 29/51]
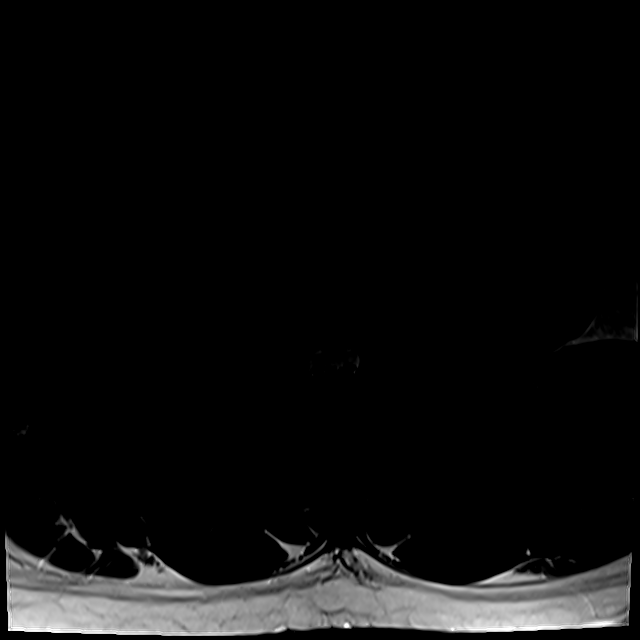
[im 36/51]
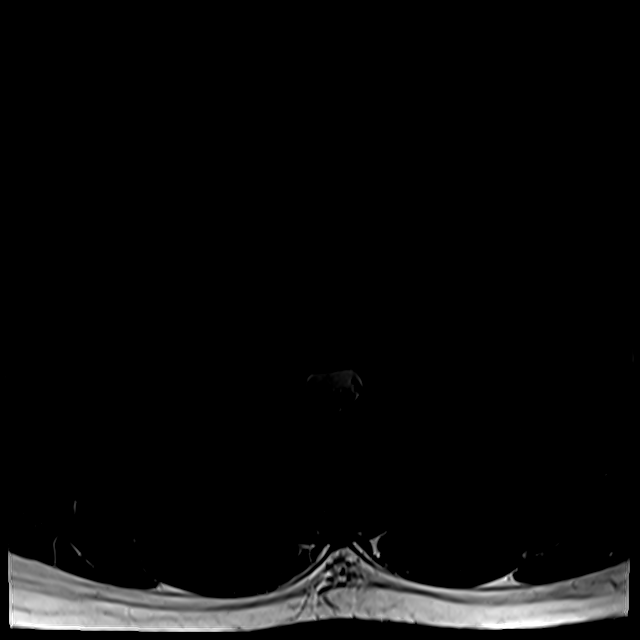
[im 43/51]
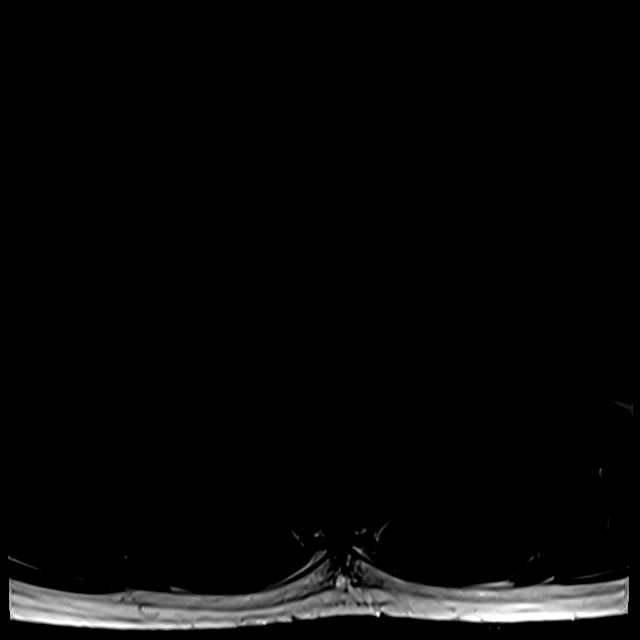
[im 51/51]
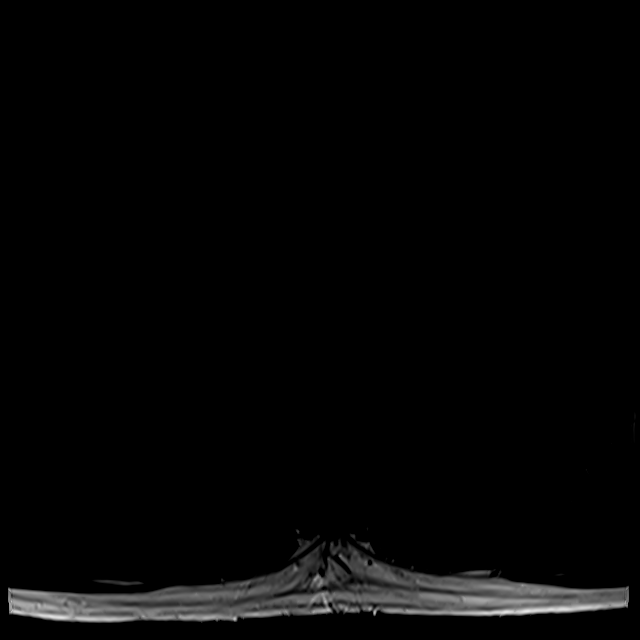

[Series 13: T1 · axial · 4.0mm · 0.35mm/px · z∈[-128,+47]mm · 3 of 51 slices shown (2 of 2)]
[im 8/51]
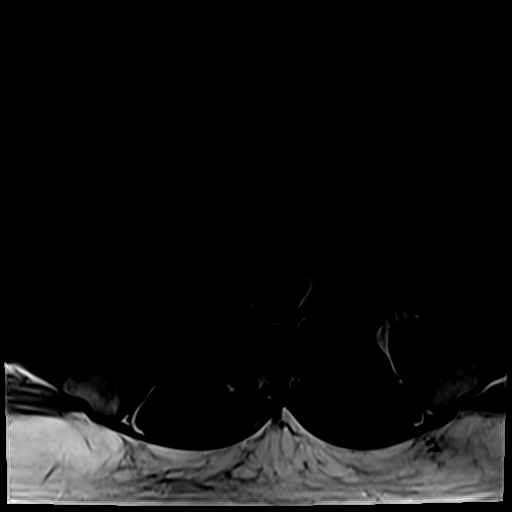
[im 26/51]
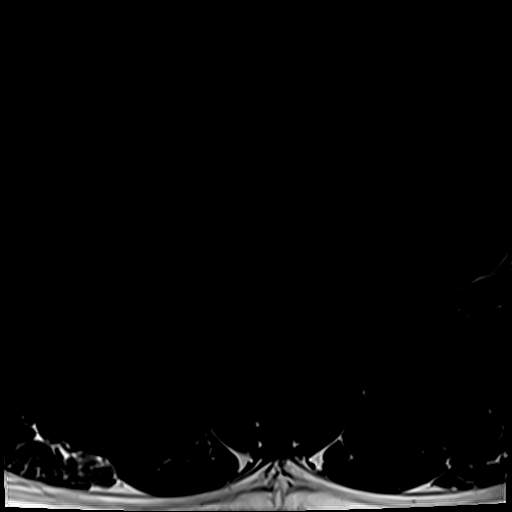
[im 43/51]
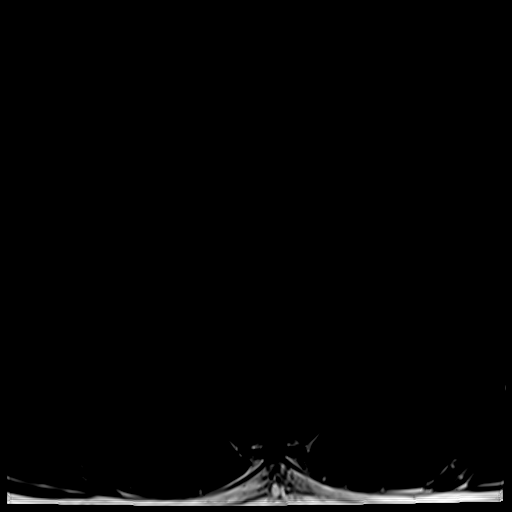

[23 of 48 positions shown; findings below may reference images not displayed]

FINDINGS: Segmentation: Standard. Lowest well-formed disc space labeled the
L5-S1 level.

Alignment: Reversal of the normal lumbar lordosis. 3 mm
retrolisthesis of L1 on L2 and L2 on L3, with trace retrolisthesis
of L3 on L4.

Vertebrae: Vertebral body height maintained without acute or chronic
fracture. Bone marrow signal intensity within normal limits. No
discrete or worrisome osseous lesions. No abnormal marrow edema.

Conus medullaris and cauda equina: Conus extends to the T12 level.
Conus and cauda equina appear normal.

Paraspinal and other soft tissues: Unremarkable.

Disc levels:

L1-2: Retrolisthesis. Degenerative intervertebral disc space
narrowing with diffuse disc bulge and disc desiccation. Reactive
endplate spurring. Mild facet spurring. No significant spinal
stenosis. Foramina remain patent.

L2-3: Retrolisthesis. Degenerative intervertebral disc space
narrowing with diffuse disc bulge and disc desiccation. Disc bulging
eccentric to the right. Associated reactive endplate spurring. Mild
facet hypertrophy. Resultant mild to moderate right with mild left
lateral recess stenosis. Central canal remains patent. No
significant foraminal encroachment.

L3-4: Disc bulge with disc desiccation. Mild facet and ligament
flavum hypertrophy. Resultant mild canal with bilateral lateral
recess stenosis. Mild to moderate bilateral L3 foraminal narrowing.

L4-5: Disc desiccation with mild disc bulge. Superimposed small
central disc protrusion with annular fissure (series 10, image 37).
Mild facet and ligament flavum hypertrophy. No significant spinal
stenosis. Mild bilateral L4 foraminal stenosis.

L5-S1: Negative interspace. Mild facet hypertrophy. No canal or
foraminal stenosis or evidence for neural impingement.
IMPRESSION: 1. Right eccentric disc bulge with facet hypertrophy at L2-3 with
resultant mild to moderate right greater than left lateral recess
stenosis.
2. Disc bulge with facet hypertrophy at L3-4 with resultant mild
canal and bilateral lateral recess stenosis, with mild to moderate
bilateral L3 foraminal narrowing.
3. Additional spondylosis at L1-2 and L4-5 without significant
stenosis or neural impingement.

## 2021-09-21 ENCOUNTER — Ambulatory Visit: Payer: 59 | Admitting: Internal Medicine

## 2021-09-22 HISTORY — PX: VASECTOMY: SHX75

## 2021-10-05 ENCOUNTER — Other Ambulatory Visit: Payer: Self-pay

## 2021-10-05 ENCOUNTER — Ambulatory Visit: Payer: 59 | Admitting: Internal Medicine

## 2021-10-05 ENCOUNTER — Encounter: Payer: Self-pay | Admitting: Internal Medicine

## 2021-10-05 NOTE — Assessment & Plan Note (Signed)
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

## 2021-10-05 NOTE — Progress Notes (Signed)
? ?  Subjective:  ? ? Patient ID: Melvin Hill, male    DOB: 07/20/1981, 41 y.o.   MRN: [id number] ? ?HPI ?Here due to concerns about obesity ? ?Wonders about ozempic ?Wife is on it and doing well ? ?Has been working out regularly ?Trying to eat better ?BP running up--but now is better ? ?When he eats---will feel full, but then hungry again after an hour ?Winds up snacking at night--and even wakes and needs a snack in the night ? ?Current Outpatient Medications on File Prior to Visit  ?Medication Sig Dispense Refill  ? amLODipine (NORVASC) 5 MG tablet Take 1 tablet (5 mg total) by mouth daily. 90 tablet 3  ? cyclobenzaprine (FLEXERIL) 10 MG tablet TAKE 0.5-1 TABLETS (5-10 MG TOTAL) BY MOUTH AT BEDTIME AS NEEDED FOR MUSCLE SPASMS. 30 tablet 1  ? ?No current facility-administered medications on file prior to visit.  ? ? ?No Known Allergies ? ?Past Medical History:  ?Diagnosis Date  ? History of chicken pox   ? Obesity   ? ? ?Past Surgical History:  ?Procedure Laterality Date  ? ANTERIOR CRUCIATE LIGAMENT REPAIR  2001  ? Left knee  ? ? ?Family History  ?Problem Relation Age of Onset  ? Diabetes Father   ? Diabetes Maternal Grandfather   ? Stroke Maternal Grandfather   ? Diabetes Maternal Grandmother   ? Coronary artery disease Maternal Grandmother   ? Cancer Neg Hx   ? Hypertension Neg Hx   ? ? ?Social History  ? ?Socioeconomic History  ? Marital status: Married  ?  Spouse name: Not on file  ? Number of children: 0  ? Years of education: Not on file  ? Highest education level: Not on file  ?Occupational History  ? Occupation: IT sales professional  ?Tobacco Use  ? Smoking status: Never  ? Smokeless tobacco: Never  ?Substance and Sexual Activity  ? Alcohol use: Yes  ?  Comment: Very rare  ? Drug use: No  ? Sexual activity: Not on file  ?Other Topics Concern  ? Not on file  ?Social History Narrative  ? Caffeine: some soda, tea 2-3 cups/day  ? Lives with fiancee, 1 dog  ? Was football lineman during college  ? Edu: [employer],  exercise sports science  ? Occupation: IT sales professional  ? Activity: working out - [employer], some running (1 mi/day), training at station  ? Diet: some water, some vegetables.  ? ?Social Determinants of Health  ? ?Financial Resource Strain: Not on file  ?Food Insecurity: Not on file  ?Transportation Needs: Not on file  ?Physical Activity: Not on file  ?Stress: Not on file  ?Social Connections: Not on file  ?Intimate Partner Violence: Not on file  ? ?Review of Systems ?Last sugar elevated ?Sleeps fair---some bad nights at times ?Has had some back problems--but better lately ?Did play OL at [employer] for college---playing weight 308 ?Did have some anxiety in the past---low dose duloxetine did help that ?Seeing neurologist about neuropathy ?   ?Objective:  ? Physical Exam ?Constitutional:   ?   Appearance: Normal appearance.  ?Neurological:  ?   Mental Status: He is alert.  ?Psychiatric:     ?   Mood and Affect: Mood normal.     ?   Behavior: Behavior normal.  ?  ? ? ? ? ?   ?Assessment & Plan:  ? ?

## 2021-10-12 ENCOUNTER — Encounter: Payer: Self-pay | Admitting: Internal Medicine

## 2021-11-17 DIAGNOSIS — R200 Anesthesia of skin: Secondary | ICD-10-CM | POA: Insufficient documentation

## 2022-01-21 ENCOUNTER — Encounter: Payer: 59 | Admitting: Internal Medicine

## 2022-01-27 ENCOUNTER — Encounter: Payer: 59 | Admitting: Internal Medicine

## 2022-02-15 ENCOUNTER — Other Ambulatory Visit: Payer: Self-pay | Admitting: Internal Medicine

## 2022-02-16 ENCOUNTER — Encounter: Payer: Self-pay | Admitting: Internal Medicine

## 2022-02-16 MED ORDER — AMLODIPINE BESYLATE 5 MG PO TABS: 5.0000 mg | ORAL_TABLET | Freq: Every day | ORAL | 3 refills | Status: DC

## 2022-02-16 NOTE — Telephone Encounter (Signed)
Pt's message in refill request: Patient comment: Been taking this and exercising quite a bit. At neurologist today BP was 162/86. Should I switch meds or increase dose?

## 2022-03-01 ENCOUNTER — Ambulatory Visit (INDEPENDENT_AMBULATORY_CARE_PROVIDER_SITE_OTHER): Payer: 59 | Admitting: Internal Medicine

## 2022-03-01 ENCOUNTER — Encounter: Payer: Self-pay | Admitting: Internal Medicine

## 2022-03-01 VITALS — BP 116/68 | HR 62 | Temp 97.5°F | Ht 76.0 in | Wt 309.0 lb

## 2022-03-01 DIAGNOSIS — I10 Essential (primary) hypertension: Secondary | ICD-10-CM | POA: Diagnosis not present

## 2022-03-01 DIAGNOSIS — G629 Polyneuropathy, unspecified: Secondary | ICD-10-CM

## 2022-03-01 DIAGNOSIS — Z0000 Encounter for general adult medical examination without abnormal findings: Secondary | ICD-10-CM | POA: Diagnosis not present

## 2022-03-01 LAB — LIPID PANEL
Cholesterol: 178 mg/dL (ref 0–200)
HDL: 38.4 mg/dL — ABNORMAL LOW (ref >39.00)
LDL Cholesterol: 109 mg/dL — ABNORMAL HIGH (ref 0–99)
NonHDL: 139.61
Total CHOL/HDL Ratio: 5
Triglycerides: 152 mg/dL — ABNORMAL HIGH (ref 0.0–149.0)
VLDL: 30.4 mg/dL (ref 0.0–40.0)

## 2022-03-01 LAB — CBC
HCT: 41.8 % (ref 39.0–52.0)
Hemoglobin: 14.4 g/dL (ref 13.0–17.0)
MCHC: 34.4 g/dL (ref 30.0–36.0)
MCV: 87.8 fl (ref 78.0–100.0)
Platelets: 252 10*3/uL (ref 150.0–400.0)
RBC: 4.76 Mil/uL (ref 4.22–5.81)
RDW: 13.2 % (ref 11.5–15.5)
WBC: 6.7 10*3/uL (ref 4.0–10.5)

## 2022-03-01 LAB — COMPREHENSIVE METABOLIC PANEL
ALT: 47 U/L (ref 0–53)
AST: 67 U/L — ABNORMAL HIGH (ref 0–37)
Albumin: 4.6 g/dL (ref 3.5–5.2)
Alkaline Phosphatase: 49 U/L (ref 39–117)
BUN: 21 mg/dL (ref 6–23)
CO2: 29 mEq/L (ref 19–32)
Calcium: 9.1 mg/dL (ref 8.4–10.5)
Chloride: 100 mEq/L (ref 96–112)
Creatinine, Ser: 0.92 mg/dL (ref 0.40–1.50)
GFR: 104.61 mL/min (ref >60.00)
Glucose, Bld: 84 mg/dL (ref 70–99)
Potassium: 4.3 mEq/L (ref 3.5–5.1)
Sodium: 137 mEq/L (ref 135–145)
Total Bilirubin: 0.8 mg/dL (ref 0.2–1.2)
Total Protein: 7.2 g/dL (ref 6.0–8.3)

## 2022-03-01 NOTE — Assessment & Plan Note (Signed)
Better now--especially with nortriptyline 40mg  bedtime May wean gabapentin soon On duloxetine 30 bid

## 2022-03-01 NOTE — Assessment & Plan Note (Signed)
BP Readings from Last 3 Encounters:  03/01/22 116/68  10/05/21 112/80  02/02/21 126/88   Good control on amlodipine 5mg  daily Will check labs

## 2022-03-01 NOTE — Progress Notes (Signed)
Subjective:    Patient ID: Melvin Hill, male    DOB: 1981/04/21, 41 y.o.   MRN: [id number]  HPI Here for physical  Reviewed BP issues Hasn't been monitoring at home  Satisfied with Rx for neuropathy Right foot only occ tingling Left foot not as bad--recently started nortriptyline Duloxetine and gabapentin twice a day  Current Outpatient Medications on File Prior to Visit  Medication Sig Dispense Refill   amLODipine (NORVASC) 5 MG tablet Take 1 tablet (5 mg total) by mouth daily. 90 tablet 3   cyclobenzaprine (FLEXERIL) 10 MG tablet TAKE 0.5-1 TABLETS (5-10 MG TOTAL) BY MOUTH AT BEDTIME AS NEEDED FOR MUSCLE SPASMS. 30 tablet 1   DULoxetine (CYMBALTA) 30 MG capsule Take 30 mg by mouth daily.     gabapentin (NEURONTIN) 600 MG tablet Take 1,200 mg by mouth 2 (two) times daily.     nortriptyline (PAMELOR) 10 MG capsule Take by mouth. Take 4 tablets at bed time     No current facility-administered medications on file prior to visit.    Not on File  Past Medical History:  Diagnosis Date   History of chicken pox    Obesity     Past Surgical History:  Procedure Laterality Date   ANTERIOR CRUCIATE LIGAMENT REPAIR  07/26/1999   Left knee   VASECTOMY  09/2021    Family History  Problem Relation Age of Onset   Diabetes Father    Diabetes Maternal Grandfather    Stroke Maternal Grandfather    Diabetes Maternal Grandmother    Coronary artery disease Maternal Grandmother    Cancer Neg Hx    Hypertension Neg Hx     Social History   Socioeconomic History   Marital status: Married    Spouse name: Not on file   Number of children: 0   Years of education: Not on file   Highest education level: Not on file  Occupational History   Occupation: IT sales professional  Tobacco Use   Smoking status: Never    Passive exposure: Never   Smokeless tobacco: Never  Substance and Sexual Activity   Alcohol use: Yes    Comment: Very rare   Drug use: No   Sexual activity: Not on file   Other Topics Concern   Not on file  Social History Narrative   Caffeine: some soda, tea 2-3 cups/day   Lives with fiancee, 1 dog   Was football lineman during college   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   Social Determinants of Health   Financial Resource Strain: Not on file  Food Insecurity: Not on file  Transportation Needs: Not on file  Physical Activity: Not on file  Stress: Not on file  Social Connections: Not on file  Intimate Partner Violence: Not on file   Review of Systems  Constitutional:  Negative for fatigue.       Has been training for half marathon---has lost a few pounds Wears seat belt  HENT:  Negative for dental problem, hearing loss and tinnitus.   Eyes:  Negative for visual disturbance.       No diplopia or unilateral vision loss  Respiratory:  Negative for cough, chest tightness and shortness of breath.   Cardiovascular:  Negative for chest pain, palpitations and leg swelling.  Gastrointestinal:  Negative for blood in stool and constipation.  Minimal heartburn lately---related to past anxiety No meds  Endocrine: Negative for polydipsia and polyuria.  Genitourinary:  Negative for difficulty urinating and urgency.       No sexual problems  Musculoskeletal:  Negative for joint swelling.       Some back and neck pain--relates to his lifting  Skin:  Negative for rash.  Allergic/Immunologic: Positive for environmental allergies. Negative for immunocompromised state.       Symptoms only in the spring--doesn't use meds  Neurological:  Negative for dizziness, syncope, light-headedness and headaches.  Hematological:  Negative for adenopathy. Does not bruise/bleed easily.  Psychiatric/Behavioral:  Negative for dysphoric mood and sleep disturbance. The patient is not nervous/anxious.        Had  work related stress---seems better on duloxetine       Objective:   Physical Exam Constitutional:      Appearance: Normal appearance.  HENT:     Mouth/Throat:     Pharynx: No oropharyngeal exudate or posterior oropharyngeal erythema.  Eyes:     Conjunctiva/sclera: Conjunctivae normal.     Pupils: Pupils are equal, round, and reactive to light.  Cardiovascular:     Rate and Rhythm: Regular rhythm.     Pulses: Normal pulses.     Heart sounds: No murmur heard.    No gallop.     Comments: HR 58 Pulmonary:     Effort: Pulmonary effort is normal.     Breath sounds: Normal breath sounds. No wheezing or rales.  Abdominal:     Palpations: Abdomen is soft.     Tenderness: There is no abdominal tenderness.  Musculoskeletal:     Cervical back: Neck supple.     Right lower leg: No edema.     Left lower leg: No edema.  Lymphadenopathy:     Cervical: No cervical adenopathy.  Skin:    Findings: No lesion or rash.  Neurological:     General: No focal deficit present.     Mental Status: He is alert and oriented to person, place, and time.  Psychiatric:        Mood and Affect: Mood normal.        Behavior: Behavior normal.            Assessment & Plan:

## 2022-03-01 NOTE — Assessment & Plan Note (Addendum)
Healthy BMI doesn't really reflect his weight status (just overweight) No cancer screening yet Works on fitness Td updated at work Will get updated COVID and flu vaccines this fall

## 2022-05-02 ENCOUNTER — Encounter: Payer: Self-pay | Admitting: Internal Medicine

## 2022-05-05 ENCOUNTER — Telehealth: Payer: Self-pay | Admitting: Internal Medicine

## 2022-05-05 NOTE — Telephone Encounter (Signed)
Patient called in and would like a copy of his recent lab results to be sent to him by email if possible. He is able to come by and pick up if not able to email. His email is [email or web address]. Thank you!

## 2022-05-06 NOTE — Telephone Encounter (Signed)
Called patient and let him know that results will be emailed to him now

## 2022-08-01 ENCOUNTER — Encounter: Payer: Self-pay | Admitting: Internal Medicine

## 2022-08-02 ENCOUNTER — Ambulatory Visit: Payer: 59 | Admitting: Internal Medicine

## 2022-08-02 ENCOUNTER — Encounter: Payer: Self-pay | Admitting: Internal Medicine

## 2022-08-02 VITALS — BP 120/84 | HR 62 | Temp 97.6°F | Ht 76.0 in | Wt 318.0 lb

## 2022-08-02 DIAGNOSIS — J0110 Acute frontal sinusitis, unspecified: Secondary | ICD-10-CM | POA: Diagnosis not present

## 2022-08-02 DIAGNOSIS — J019 Acute sinusitis, unspecified: Secondary | ICD-10-CM | POA: Insufficient documentation

## 2022-08-02 MED ORDER — AMOXICILLIN-POT CLAVULANATE 875-125 MG PO TABS: 1.0000 | ORAL_TABLET | Freq: Two times a day (BID) | ORAL | 0 refills | Status: DC

## 2022-08-02 NOTE — Progress Notes (Signed)
Subjective:    Patient ID: Melvin Hill, male    DOB: 04/25/1981, 42 y.o.   MRN: [id number]  HPI Here due to a sinus infection  Having head and ear congestion--~2-3 weeks Popping and pain--more on right---sudafed not much help Frontal pressure---some better when up and moving around No fever No chills or sweats Some drainage---only slight cough with phlegm today No sore throat No SOB  Uses afrin briefly  Current Outpatient Medications on File Prior to Visit  Medication Sig Dispense Refill   amLODipine (NORVASC) 5 MG tablet Take 1 tablet (5 mg total) by mouth daily. 90 tablet 3   cyclobenzaprine (FLEXERIL) 10 MG tablet TAKE 0.5-1 TABLETS (5-10 MG TOTAL) BY MOUTH AT BEDTIME AS NEEDED FOR MUSCLE SPASMS. 30 tablet 1   DULoxetine (CYMBALTA) 30 MG capsule Take 30 mg by mouth daily.     gabapentin (NEURONTIN) 600 MG tablet Take 600 mg by mouth daily.     nortriptyline (PAMELOR) 10 MG capsule Take by mouth. Take 4 tablets at bed time     No current facility-administered medications on file prior to visit.    No Known Allergies  Past Medical History:  Diagnosis Date   History of chicken pox    Obesity     Past Surgical History:  Procedure Laterality Date   ANTERIOR CRUCIATE LIGAMENT REPAIR  07/26/1999   Left knee   VASECTOMY  09/2021    Family History  Problem Relation Age of Onset   Diabetes Father    Diabetes Maternal Grandfather    Stroke Maternal Grandfather    Diabetes Maternal Grandmother    Coronary artery disease Maternal Grandmother    Cancer Neg Hx    Hypertension Neg Hx     Social History   Socioeconomic History   Marital status: Married    Spouse name: Not on file   Number of children: 0   Years of education: Not on file   Highest education level: Not on file  Occupational History   Occupation: IT sales professional  Tobacco Use   Smoking status: Never    Passive exposure: Never   Smokeless tobacco: Never  Substance and Sexual Activity   Alcohol  use: Yes    Comment: Very rare   Drug use: No   Sexual activity: Not on file  Other Topics Concern   Not on file  Social History Narrative   Caffeine: some soda, tea 2-3 cups/day   Lives with fiancee, 1 dog   Was football lineman during college   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   Social Determinants of Health   Financial Resource Strain: Not on file  Food Insecurity: Not on file  Transportation Needs: Not on file  Physical Activity: Not on file  Stress: Not on file  Social Connections: Not on file  Intimate Partner Violence: Not on file   Review of Systems No change in smell or taste No N/V Didn't check for COVID Eating okay    Objective:   Physical Exam Constitutional:      Appearance: Normal appearance.  HENT:     Head:     Comments: No sinus tenderness    Right Ear: Tympanic membrane and ear canal normal.     Left Ear: Tympanic membrane and ear canal normal.     Nose: Congestion present.  Mouth/Throat:     Pharynx: No oropharyngeal exudate or posterior oropharyngeal erythema.  Pulmonary:     Effort: Pulmonary effort is normal.     Breath sounds: Normal breath sounds. No wheezing or rales.  Musculoskeletal:     Cervical back: Neck supple.  Lymphadenopathy:     Cervical: No cervical adenopathy.  Neurological:     Mental Status: He is alert.            Assessment & Plan:

## 2022-08-02 NOTE — Assessment & Plan Note (Signed)
3 weeks of symptoms Will try augmentin 875 bid for 7 days NSAIDs prn for pain Consider flonase for persistent symptoms

## 2023-01-06 ENCOUNTER — Ambulatory Visit
Admission: EM | Admit: 2023-01-06 | Discharge: 2023-01-06 | Disposition: A | Discharge status: Home / Self Care | Payer: 59

## 2023-01-06 DIAGNOSIS — B349 Viral infection, unspecified: Secondary | ICD-10-CM

## 2023-01-06 LAB — POCT INFLUENZA A/B
Influenza A, POC: NEGATIVE
Influenza B, POC: NEGATIVE

## 2023-01-06 NOTE — Discharge Instructions (Signed)
Rapid flu was negative.  Suspect viral illness that should run its course as we discussed.  Follow-up if any symptoms persist or worsen.

## 2023-01-06 NOTE — ED Triage Notes (Signed)
Pt reports body aches, feels cold, headache, chest congestion, fatigue, and chest congestion when taking a deep breath x 1 day. Tramadol gives no relief.

## 2023-01-06 NOTE — ED Provider Notes (Signed)
[name]    CSN: [id number] Arrival date & time: 01/06/23  1408      History   Chief Complaint Chief Complaint  Patient presents with   Generalized Body Aches    Entered by patient    HPI Melvin Hill is a 42 y.o. male.   Patient presents with nasal congestion, fatigue, headache, body aches that started yesterday.  Patient denies cough, sore throat, fever. Denies nausea, vomiting, diarrhea.  Denies any obvious known sick contacts.  He reports that he has not taken any medications for symptoms.  Patient is requesting a flu test.  Patient states that he has back pain that wraps around bilateral ribs with any coughing or inspiration.  Denies shortness of breath.     Past Medical History:  Diagnosis Date   History of chicken pox    Obesity     Patient Active Problem List   Diagnosis Date Noted   Acute non-recurrent frontal sinusitis 08/02/2022   Preventative health care 03/01/2022   Essential hypertension, benign 01/15/2021   Neuropathy 11/18/2020   Gastroesophageal reflux disease 03/09/2020    Past Surgical History:  Procedure Laterality Date   ANTERIOR CRUCIATE LIGAMENT REPAIR  07/26/1999   Left knee   VASECTOMY  09/2021       Home Medications    Prior to Admission medications   Medication Sig Start Date End Date Taking? Authorizing Provider  omeprazole (PRILOSEC) 40 MG capsule Take 40 mg by mouth daily. 12/13/22  Yes [provider]  amLODipine (NORVASC) 5 MG tablet Take 1 tablet (5 mg total) by mouth daily. 02/16/22 02/16/23  Karie Schwalbe, MD  amoxicillin-clavulanate (AUGMENTIN) 875-125 MG tablet Take 1 tablet by mouth 2 (two) times daily. 08/02/22   Karie Schwalbe, MD  cyclobenzaprine (FLEXERIL) 10 MG tablet TAKE 0.5-1 TABLETS (5-10 MG TOTAL) BY MOUTH AT BEDTIME AS NEEDED FOR MUSCLE SPASMS. 06/09/21   Karie Schwalbe, MD  DULoxetine (CYMBALTA) 30 MG capsule Take 30 mg by mouth daily.    [provider]  gabapentin  (NEURONTIN) 600 MG tablet Take 600 mg by mouth daily.    [provider]  nortriptyline (PAMELOR) 10 MG capsule Take by mouth. Take 4 tablets at bed time 01/02/22   [provider]    Family History Family History  Problem Relation Age of Onset   Diabetes Father    Diabetes Maternal Grandfather    Stroke Maternal Grandfather    Diabetes Maternal Grandmother    Coronary artery disease Maternal Grandmother    Cancer Neg Hx    Hypertension Neg Hx     Social History Social History   Tobacco Use   Smoking status: Never    Passive exposure: Never   Smokeless tobacco: Never  Vaping Use   Vaping Use: Never used  Substance Use Topics   Alcohol use: Yes    Comment: Very rare   Drug use: No     Allergies   Patient has no known allergies.   Review of Systems Review of Systems Per HPI  Physical Exam Triage Vital Signs ED Triage Vitals  Enc Vitals Group     BP 01/06/23 1447 127/80     Pulse Rate 01/06/23 1447 79     Resp 01/06/23 1447 18     Temp 01/06/23 1447 99 F (37.2 C)     Temp Source 01/06/23 1447 Oral     SpO2 01/06/23 1447 97 %     Weight --  Height --      Head Circumference --      Peak Flow --      Pain Score 01/06/23 1451 0     Pain Loc --      Pain Edu? --      Excl. in GC? --    No data found.  Updated Vital Signs BP 127/80 (BP Location: Left Arm)   Pulse 79   Temp 99 F (37.2 C) (Oral)   Resp 18   SpO2 97%   Visual Acuity Right Eye Distance:   Left Eye Distance:   Bilateral Distance:    Right Eye Near:   Left Eye Near:    Bilateral Near:     Physical Exam Constitutional:      General: He is not in acute distress.    Appearance: Normal appearance. He is not toxic-appearing or diaphoretic.  HENT:     Head: Normocephalic and atraumatic.     Right Ear: Tympanic membrane and ear canal normal.     Left Ear: Tympanic membrane and ear canal normal.     Nose: Congestion present.     Mouth/Throat:     Mouth: Mucous  membranes are moist.     Pharynx: No posterior oropharyngeal erythema.  Eyes:     Extraocular Movements: Extraocular movements intact.     Conjunctiva/sclera: Conjunctivae normal.     Pupils: Pupils are equal, round, and reactive to light.  Cardiovascular:     Rate and Rhythm: Normal rate and regular rhythm.     Pulses: Normal pulses.     Heart sounds: Normal heart sounds.  Pulmonary:     Effort: Pulmonary effort is normal. No respiratory distress.     Breath sounds: Normal breath sounds. No stridor. No wheezing, rhonchi or rales.  Abdominal:     General: Abdomen is flat. Bowel sounds are normal.     Palpations: Abdomen is soft.  Musculoskeletal:        General: Normal range of motion.     Cervical back: Normal range of motion.  Skin:    General: Skin is warm and dry.  Neurological:     General: No focal deficit present.     Mental Status: He is alert and oriented to person, place, and time. Mental status is at baseline.  Psychiatric:        Mood and Affect: Mood normal.        Behavior: Behavior normal.      UC Treatments / Results  Labs (all labs ordered are listed, but only abnormal results are displayed) Labs Reviewed  POCT INFLUENZA A/B    EKG   Radiology No results found.  Procedures Procedures (including critical care time)  Medications Ordered in UC Medications - No data to display  Initial Impression / Assessment and Plan / UC Course  I have reviewed the triage vital signs and the nursing notes.  Pertinent labs & imaging results that were available during my care of the patient were reviewed by me and considered in my medical decision making (see chart for details).     Suspect viral cause of symptoms.  Rapid flu was negative.  Patient declined COVID testing.  There are no adventitious lung sounds on exam but patient was offered chest imaging given pain with inspiration but he declined this.  No concern for PE at this time.  Advised supportive care  and symptom management and following up if symptoms persist or worsen.  Patient verbalized understanding and was  agreeable with plan. Final Clinical Impressions(s) / UC Diagnoses   Final diagnoses:  Viral illness     Discharge Instructions      Rapid flu was negative.  Suspect viral illness that should run its course as we discussed.  Follow-up if any symptoms persist or worsen.    ED Prescriptions   None    PDMP not reviewed this encounter.   Gustavus Bryant, [place] 01/06/23 [phone]

## 2023-01-09 ENCOUNTER — Encounter: Payer: Self-pay | Admitting: Internal Medicine

## 2023-01-10 MED ORDER — CYCLOBENZAPRINE HCL 10 MG PO TABS: 5.0000 mg | ORAL_TABLET | Freq: Every evening | ORAL | 1 refills | Status: DC | PRN

## 2023-03-06 ENCOUNTER — Encounter: Payer: 59 | Admitting: Internal Medicine

## 2023-03-07 ENCOUNTER — Encounter: Payer: Self-pay | Admitting: Internal Medicine

## 2023-03-23 ENCOUNTER — Encounter: Payer: 59 | Admitting: Internal Medicine

## 2023-04-04 ENCOUNTER — Encounter: Payer: Self-pay | Admitting: Internal Medicine

## 2023-04-04 ENCOUNTER — Ambulatory Visit (INDEPENDENT_AMBULATORY_CARE_PROVIDER_SITE_OTHER): Payer: 59 | Admitting: Internal Medicine

## 2023-04-04 VITALS — BP 124/84 | HR 56 | Temp 97.5°F | Ht 76.0 in | Wt 308.0 lb

## 2023-04-04 DIAGNOSIS — Z1159 Encounter for screening for other viral diseases: Secondary | ICD-10-CM | POA: Diagnosis not present

## 2023-04-04 DIAGNOSIS — I10 Essential (primary) hypertension: Secondary | ICD-10-CM

## 2023-04-04 DIAGNOSIS — Z114 Encounter for screening for human immunodeficiency virus [HIV]: Secondary | ICD-10-CM | POA: Diagnosis not present

## 2023-04-04 DIAGNOSIS — K219 Gastro-esophageal reflux disease without esophagitis: Secondary | ICD-10-CM

## 2023-04-04 DIAGNOSIS — Z0000 Encounter for general adult medical examination without abnormal findings: Secondary | ICD-10-CM | POA: Diagnosis not present

## 2023-04-04 DIAGNOSIS — R5383 Other fatigue: Secondary | ICD-10-CM | POA: Insufficient documentation

## 2023-04-04 LAB — CBC
HCT: 44.3 % (ref 39.0–52.0)
Hemoglobin: 14.8 g/dL (ref 13.0–17.0)
MCHC: 33.4 g/dL (ref 30.0–36.0)
MCV: 88.3 fl (ref 78.0–100.0)
Platelets: 265 10*3/uL (ref 150.0–400.0)
RBC: 5.02 Mil/uL (ref 4.22–5.81)
RDW: 13.3 % (ref 11.5–15.5)
WBC: 7 10*3/uL (ref 4.0–10.5)

## 2023-04-04 LAB — COMPREHENSIVE METABOLIC PANEL
ALT: 32 U/L (ref 0–53)
AST: 26 U/L (ref 0–37)
Albumin: 4.4 g/dL (ref 3.5–5.2)
Alkaline Phosphatase: 53 U/L (ref 39–117)
BUN: 22 mg/dL (ref 6–23)
CO2: 28 meq/L (ref 19–32)
Calcium: 9.4 mg/dL (ref 8.4–10.5)
Chloride: 105 meq/L (ref 96–112)
Creatinine, Ser: 1.02 mg/dL (ref 0.40–1.50)
GFR: 91.73 mL/min (ref >60.00)
Glucose, Bld: 90 mg/dL (ref 70–99)
Potassium: 4.5 meq/L (ref 3.5–5.1)
Sodium: 139 meq/L (ref 135–145)
Total Bilirubin: 0.6 mg/dL (ref 0.2–1.2)
Total Protein: 7.5 g/dL (ref 6.0–8.3)

## 2023-04-04 LAB — TESTOSTERONE: Testosterone: 180.36 ng/dL — ABNORMAL LOW (ref 300.00–890.00)

## 2023-04-04 NOTE — Assessment & Plan Note (Signed)
And gastritis  On omeprazole

## 2023-04-04 NOTE — Progress Notes (Signed)
Subjective:    Patient ID: Melvin Hill, male    DOB: November 21, 1980, 42 y.o.   MRN: [id number]  HPI Here for physical  Wondering about testosterone level Notes some fatigue--wondering about taking supplements Works out regular--but trouble with recovery. Is able to run half marathon last year No sexual problems  Off all neuropathy meds Going back to neurologist soon---feet symptoms are coming back some with longer distance running (working up to half marathon again)  Had colonoscopy--benign EGD benign --still on omeprazole for gastritis (better now)--eating better Adds pepcid AC if breaks through  Current Outpatient Medications on File Prior to Visit  Medication Sig Dispense Refill   cyclobenzaprine (FLEXERIL) 10 MG tablet Take 0.5-1 tablets (5-10 mg total) by mouth at bedtime as needed for muscle spasms. 30 tablet 1   omeprazole (PRILOSEC) 40 MG capsule Take 40 mg by mouth daily.     amLODipine (NORVASC) 5 MG tablet Take 1 tablet (5 mg total) by mouth daily. 90 tablet 3   No current facility-administered medications on file prior to visit.    No Known Allergies  Past Medical History:  Diagnosis Date   History of chicken pox    Obesity     Past Surgical History:  Procedure Laterality Date   ANTERIOR CRUCIATE LIGAMENT REPAIR  07/26/1999   Left knee   VASECTOMY  09/2021    Family History  Problem Relation Age of Onset   Diabetes Father    Diabetes Maternal Grandfather    Stroke Maternal Grandfather    Diabetes Maternal Grandmother    Coronary artery disease Maternal Grandmother    Cancer Neg Hx    Hypertension Neg Hx     Social History   Socioeconomic History   Marital status: Married    Spouse name: Not on file   Number of children: 0   Years of education: Not on file   Highest education level: Not on file  Occupational History   Occupation: IT sales professional  Tobacco Use   Smoking status: Never    Passive exposure: Never   Smokeless tobacco: Never   Vaping Use   Vaping status: Never Used  Substance and Sexual Activity   Alcohol use: Yes    Comment: Very rare   Drug use: No   Sexual activity: Yes  Other Topics Concern   Not on file  Social History Narrative   Caffeine: some soda, tea 2-3 cups/day   Lives with fiancee, 1 dog   Was football lineman during college   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   Social Determinants of Health   Financial Resource Strain: Not on file  Food Insecurity: Not on file  Transportation Needs: Not on file  Physical Activity: Not on file  Stress: Not on file  Social Connections: Not on file  Intimate Partner Violence: Not on file   Review of Systems  Constitutional:  Negative for unexpected weight change.       Wears seat belt  HENT:  Negative for dental problem, hearing loss and tinnitus.        Keeps up with dentist  Eyes:  Negative for visual disturbance.       No diplopia or unilateral vision loss  Respiratory:  Negative for cough, chest tightness and shortness of breath.   Cardiovascular:  Negative for chest pain, palpitations and leg swelling.  Gastrointestinal:  Negative for blood in stool and constipation.  Endocrine: Negative for polydipsia and polyuria.  Genitourinary:  Negative for difficulty urinating and urgency.  Musculoskeletal:  Negative for arthralgias and joint swelling.       Occ back spasm--cyclobenzaprine helps  Skin:  Negative for rash.  Allergic/Immunologic: Positive for environmental allergies. Negative for immunocompromised state.       Uses OTC med in heavy season (works Veterinary surgeon work also)  Neurological:  Negative for dizziness, syncope, light-headedness and headaches.  Hematological:  Negative for adenopathy. Does not bruise/bleed easily.  Psychiatric/Behavioral:  Negative for dysphoric mood. The  patient is not nervous/anxious.        Sleeps okay despite the shift work       Objective:   Physical Exam Constitutional:      Appearance: Normal appearance.  HENT:     Mouth/Throat:     Pharynx: No oropharyngeal exudate or posterior oropharyngeal erythema.  Eyes:     Conjunctiva/sclera: Conjunctivae normal.     Pupils: Pupils are equal, round, and reactive to light.  Cardiovascular:     Rate and Rhythm: Normal rate and regular rhythm.     Pulses: Normal pulses.     Heart sounds: No murmur heard.    No gallop.  Pulmonary:     Effort: Pulmonary effort is normal.     Breath sounds: Normal breath sounds. No wheezing or rales.  Abdominal:     Palpations: Abdomen is soft.     Tenderness: There is no abdominal tenderness.  Musculoskeletal:     Cervical back: Neck supple.     Right lower leg: No edema.     Left lower leg: No edema.  Lymphadenopathy:     Cervical: No cervical adenopathy.  Skin:    Findings: No lesion or rash.  Neurological:     General: No focal deficit present.     Mental Status: He is alert and oriented to person, place, and time.  Psychiatric:        Mood and Affect: Mood normal.        Behavior: Behavior normal.            Assessment & Plan:

## 2023-04-04 NOTE — Assessment & Plan Note (Signed)
BP Readings from Last 3 Encounters:  04/04/23 124/84  01/06/23 127/80  08/02/22 120/84   Fine on the amlodipine

## 2023-04-04 NOTE — Assessment & Plan Note (Signed)
Healthy Works out regular Had colonoscopy which was benign Will get flu vaccine at work.  Not sure about COVID vaccination

## 2023-04-04 NOTE — Assessment & Plan Note (Signed)
Relative Will check testosterone per his request

## 2023-04-05 LAB — HEPATITIS C ANTIBODY: Hepatitis C Ab: NONREACTIVE

## 2023-04-05 LAB — HIV ANTIBODY (ROUTINE TESTING W REFLEX): HIV 1&2 Ab, 4th Generation: NONREACTIVE

## 2023-04-06 MED ORDER — TESTOSTERONE CYPIONATE 200 MG/ML IM SOLN: 100.0000 mg | INTRAMUSCULAR | 0 refills | Status: DC

## 2023-04-11 ENCOUNTER — Ambulatory Visit (INDEPENDENT_AMBULATORY_CARE_PROVIDER_SITE_OTHER): Payer: 59

## 2023-04-11 ENCOUNTER — Other Ambulatory Visit: Payer: Self-pay | Admitting: Internal Medicine

## 2023-04-11 DIAGNOSIS — R7989 Other specified abnormal findings of blood chemistry: Secondary | ICD-10-CM | POA: Diagnosis not present

## 2023-04-11 MED ORDER — TESTOSTERONE CYPIONATE 200 MG/ML IM SOLN
100.0000 mg | Freq: Once | INTRAMUSCULAR | Status: AC
  Administered 2023-04-11: 100 mg via INTRAMUSCULAR

## 2023-04-11 NOTE — Progress Notes (Signed)
Patient in office today for teaching on  testosterone 100 mg IM*. Patient has brought medication with them to appointment. Printed off Instructions from [employer]. Patient reviewed instruction video on manufactures website Reviewed all instructions with patient and had repeat back to me. Instructed patient on proper cleaning of injection site and any signs of infection. Patient was able to administer medication with very little directions. Patient felt comfortable continuing in home setting. Reviewed with patient proper disposal of pen after use. Will call the office if any questions.

## 2023-04-11 NOTE — Telephone Encounter (Signed)
Pt came this morning for nurse visit for instruction of how to give himself testosterone injection and pt gave himself the testosterone injection 100 mg IM in right vastus lateralis and pt did excellent . Please let pt know taking the testosterone 100 mg IM every 2 weeks and then increase to 200 mg IM q 2 weeks if no side effects.How many 100 mg  injections without side effects before increasing dose to 200 mg.sending note to Dr Alphonsus Sias.

## 2023-05-03 MED ORDER — TESTOSTERONE CYPIONATE 200 MG/ML IM SOLN: 200.0000 mg | INTRAMUSCULAR | 0 refills | Status: DC

## 2023-05-03 MED ORDER — "SAFETY SYRINGE/NEEDLE 21G X 1-1/2"" 3 ML MISC": 1.0000 | 12 refills | Status: AC

## 2023-05-03 NOTE — Telephone Encounter (Signed)
Let him know okay to increase to with next injection

## 2023-07-05 ENCOUNTER — Other Ambulatory Visit: Payer: Self-pay | Admitting: Internal Medicine

## 2023-09-06 ENCOUNTER — Other Ambulatory Visit: Payer: Self-pay | Admitting: Internal Medicine

## 2023-11-27 ENCOUNTER — Ambulatory Visit: Payer: Self-pay | Admitting: Podiatry

## 2023-12-27 ENCOUNTER — Ambulatory Visit (INDEPENDENT_AMBULATORY_CARE_PROVIDER_SITE_OTHER)

## 2023-12-27 ENCOUNTER — Ambulatory Visit: Admitting: Podiatry

## 2023-12-27 ENCOUNTER — Encounter: Payer: Self-pay | Admitting: Podiatry

## 2023-12-27 ENCOUNTER — Other Ambulatory Visit: Payer: Self-pay | Admitting: Podiatry

## 2023-12-27 DIAGNOSIS — M7751 Other enthesopathy of right foot: Secondary | ICD-10-CM

## 2023-12-27 DIAGNOSIS — S86311A Strain of muscle(s) and tendon(s) of peroneal muscle group at lower leg level, right leg, initial encounter: Secondary | ICD-10-CM | POA: Diagnosis not present

## 2023-12-27 DIAGNOSIS — S96912A Strain of unspecified muscle and tendon at ankle and foot level, left foot, initial encounter: Secondary | ICD-10-CM

## 2023-12-27 DIAGNOSIS — M7752 Other enthesopathy of left foot: Secondary | ICD-10-CM

## 2023-12-27 DIAGNOSIS — M7989 Other specified soft tissue disorders: Secondary | ICD-10-CM

## 2023-12-27 DIAGNOSIS — M7740 Metatarsalgia, unspecified foot: Secondary | ICD-10-CM

## 2023-12-27 NOTE — Progress Notes (Signed)
 Subjective:  Patient ID: Melvin Hill, male    DOB: October 31, 1980,  MRN: [id number] HPI Chief Complaint  Patient presents with   Ankle Pain    Lateral ankle right - swelling and aching x months   Foot Pain    Arch left - marathon in April, while running felt extremely tight and has been achy since, also tingling in the toes   New Patient (Initial Visit)    43 y.o. male presents with the above complaint.   ROS: Denies fever chills nausea vomit muscle aches pains calf pain back pain chest pain shortness of breath.  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  Past Medical History:  Diagnosis Date   History of chicken pox    Obesity    Past Surgical History:  Procedure Laterality Date   ANTERIOR CRUCIATE LIGAMENT REPAIR  07/26/1999   Left knee   VASECTOMY  09/2021    Current Outpatient Medications:    amLODipine  (NORVASC ) 5 MG tablet, TAKE 1 TABLET (5 MG TOTAL) BY MOUTH DAILY., Disp: 90 tablet, Rfl: 3   cyclobenzaprine  (FLEXERIL ) 10 MG tablet, TAKE 1/2 TO 1 TABLET (5-10 MG TOTAL) BY MOUTH EVERY DAY AT BEDTIME AS NEEDED FOR MUSCLE SPASM, Disp: 30 tablet, Rfl: 1   omeprazole  (PRILOSEC) 40 MG capsule, Take 40 mg by mouth daily., Disp: , Rfl:    SYRINGE-NEEDLE, DISP, 3 ML (SAFETY SYRINGE/NEEDLE) 21G X 1-1/2" 3 ML MISC, 1 each by Does not apply route every 14 (fourteen) days., Disp: 2 each, Rfl: 12   testosterone  cypionate (DEPOTESTOSTERONE CYPIONATE) 200 MG/ML injection, INJECT 0.5-1 MLS (100-200 MG TOTAL) INTO THE MUSCLE EVERY 14 (FOURTEEN) DAYS., Disp: 2 mL, Rfl: 4  No Known Allergies Review of Systems Objective:  There were no vitals filed for this visit.  General: Well developed, nourished, in no acute distress, alert and oriented x3   Dermatological: Skin is warm, dry and supple bilateral. Nails x 10 are well maintained; remaining integument appears unremarkable at this time. There are no open sores, no preulcerative lesions, no rash or signs of infection present.  Vascular:  Dorsalis Pedis artery and Posterior Tibial artery pedal pulses are 2/4 bilateral with immedate capillary fill time. Pedal hair growth present. No varicosities and no lower extremity edema present bilateral.   Neruologic: Grossly intact via light touch bilateral. Vibratory intact via tuning fork bilateral. Protective threshold with Semmes Wienstein monofilament intact to all pedal sites bilateral. Patellar and Achilles deep tendon reflexes 2+ bilateral. No Babinski or clonus noted bilateral.   Musculoskeletal: No gross boney pedal deformities bilateral. No pain, crepitus, or limitation noted with foot and ankle range of motion bilateral. Muscular strength 5/5 in all groups tested bilateral.  Large nonpulsatile firm mass to the lateral aspect of the ankle just below the lateral malleolus.  This feels like either a large soft tissue tumor or a rupture of the peroneal tendons.  They are painful on palpation it is nonpitting.  Is not related and questionable.  Gait: Unassisted, Nonantalgic.    Radiographs:  Radiographs taken today demonstrate significant soft tissue swelling of the right lower extremity no significant osseous abnormalities to the ankle or the foot.  Left ankle and foot do not demonstrate any osseous abnormalities either some soft tissue increase in density is noted at the plantar fascial calcaneal insertion site bilateral.  Assessment & Plan:   Assessment: Soft tissue mass possible peroneal tendon tear possible neoplastic tumor.  Left foot demonstrates probable plantar fascial midfoot sprain or  strain.  Plan: Requesting MRI with contrast for the mass in the peroneal tendon tear right.  Left foot recommended that he continue conservative therapies to allow this to heal.  We will follow-up with him once his MRI is complete     Sudie Bandel T. [place], [place]

## 2024-01-05 ENCOUNTER — Ambulatory Visit
Admission: RE | Admit: 2024-01-05 | Discharge: 2024-01-05 | Disposition: A | Discharge status: Home / Self Care | Source: Ambulatory Visit | Attending: Podiatry | Admitting: Podiatry

## 2024-01-05 DIAGNOSIS — M7989 Other specified soft tissue disorders: Secondary | ICD-10-CM

## 2024-01-05 DIAGNOSIS — S86311A Strain of muscle(s) and tendon(s) of peroneal muscle group at lower leg level, right leg, initial encounter: Secondary | ICD-10-CM

## 2024-01-05 MED ORDER — GADOPICLENOL 0.5 MMOL/ML IV SOLN
10.0000 mL | Freq: Once | INTRAVENOUS | Status: AC | PRN
  Administered 2024-01-05: 10 mL via INTRAVENOUS

## 2024-01-11 ENCOUNTER — Ambulatory Visit: Payer: Self-pay | Admitting: Podiatry

## 2024-01-12 ENCOUNTER — Encounter: Payer: Self-pay | Admitting: Podiatry

## 2024-02-07 ENCOUNTER — Ambulatory Visit: Admitting: Podiatry

## 2024-02-07 DIAGNOSIS — M7731 Calcaneal spur, right foot: Secondary | ICD-10-CM | POA: Diagnosis not present

## 2024-02-07 DIAGNOSIS — M7989 Other specified soft tissue disorders: Secondary | ICD-10-CM | POA: Diagnosis not present

## 2024-02-07 DIAGNOSIS — S86311A Strain of muscle(s) and tendon(s) of peroneal muscle group at lower leg level, right leg, initial encounter: Secondary | ICD-10-CM | POA: Diagnosis not present

## 2024-02-07 NOTE — Progress Notes (Signed)
 Zamere presents today for follow-up of his MRI report of right ankle.  States that some days are better than others but it still hurts.  Objective: Vital signs are stable he is alert oriented x 3 I reviewed his past medical history medications allergies surgeries and social history.  MRI does indicate what appears to be a tear of the peroneus brevis tendon mass tenosynovitis.  Assessment peroneal tendinitis tenosynovitis tear of the peroneal tendons probable exostosis of the lateral calcaneus at the calcaneal tubercle for the peroneals.  Plan: Discussed etiology pathology and surgical therapies at this point we will consider him for peroneal tendon repair a calcaneal exostectomy.  Also a excision of soft tissue mass or synovectomy.  PRP injection and cast application.  We did discuss the possible side effects complications associated with this he understands and is amenable to it.  We did discuss this in great detail.  He understands that he is will be nonambulatory for anywhere from 2 to 3 months.  We will do this at [facility] surgical center.

## 2024-02-08 NOTE — Addendum Note (Signed)
 Addended by: ELAYNE ROSINA BRAVO on: 02/08/2024 07:37 PM   Modules accepted: Level of Service

## 2024-02-15 ENCOUNTER — Telehealth: Payer: Self-pay | Admitting: Podiatry

## 2024-02-15 NOTE — Telephone Encounter (Signed)
 Received surgical consent forms   Called pt and he asked when was the soonest and I have 8/8 and then he asked how far out he could book. I looked and if needed consent is good and I can book it thru December.   He is going to look at his work schedule and call back to get scheduled.

## 2024-02-19 ENCOUNTER — Ambulatory Visit: Admitting: Podiatry

## 2024-04-03 ENCOUNTER — Encounter: Payer: 59 | Admitting: Internal Medicine

## 2024-04-03 ENCOUNTER — Ambulatory Visit: Payer: Self-pay

## 2024-04-03 ENCOUNTER — Ambulatory Visit

## 2024-04-03 VITALS — BP 136/86 | HR 64 | Temp 97.6°F | Ht 76.0 in | Wt 316.8 lb

## 2024-04-03 DIAGNOSIS — Z79890 Hormone replacement therapy (postmenopausal): Secondary | ICD-10-CM | POA: Diagnosis not present

## 2024-04-03 DIAGNOSIS — R200 Anesthesia of skin: Secondary | ICD-10-CM

## 2024-04-03 DIAGNOSIS — Z5181 Encounter for therapeutic drug level monitoring: Secondary | ICD-10-CM | POA: Diagnosis not present

## 2024-04-03 DIAGNOSIS — R202 Paresthesia of skin: Secondary | ICD-10-CM | POA: Diagnosis not present

## 2024-04-03 DIAGNOSIS — S76112A Strain of left quadriceps muscle, fascia and tendon, initial encounter: Secondary | ICD-10-CM

## 2024-04-03 LAB — CBC WITH DIFFERENTIAL/PLATELET
Basophils Absolute: 0 K/uL (ref 0.0–0.1)
Basophils Relative: 0.6 % (ref 0.0–3.0)
Eosinophils Absolute: 0.1 K/uL (ref 0.0–0.7)
Eosinophils Relative: 1.4 % (ref 0.0–5.0)
HCT: 45.4 % (ref 39.0–52.0)
Hemoglobin: 15.5 g/dL (ref 13.0–17.0)
Lymphocytes Relative: 28.8 % (ref 12.0–46.0)
Lymphs Abs: 1.8 K/uL (ref 0.7–4.0)
MCHC: 34.2 g/dL (ref 30.0–36.0)
MCV: 87.8 fl (ref 78.0–100.0)
Monocytes Absolute: 0.4 K/uL (ref 0.1–1.0)
Monocytes Relative: 6.7 % (ref 3.0–12.0)
Neutro Abs: 3.8 K/uL (ref 1.4–7.7)
Neutrophils Relative %: 62.5 % (ref 43.0–77.0)
Platelets: 262 K/uL (ref 150.0–400.0)
RBC: 5.17 Mil/uL (ref 4.22–5.81)
RDW: 13.6 % (ref 11.5–15.5)
WBC: 6.1 K/uL (ref 4.0–10.5)

## 2024-04-03 LAB — LIPID PANEL
Cholesterol: 164 mg/dL (ref 0–200)
HDL: 36.7 mg/dL — ABNORMAL LOW (ref >39.00)
LDL Cholesterol: 100 mg/dL — ABNORMAL HIGH (ref 0–99)
NonHDL: 127.62
Total CHOL/HDL Ratio: 4
Triglycerides: 138 mg/dL (ref 0.0–149.0)
VLDL: 27.6 mg/dL (ref 0.0–40.0)

## 2024-04-03 LAB — VITAMIN B12: Vitamin B-12: 215 pg/mL (ref 211–911)

## 2024-04-03 LAB — HEMOGLOBIN A1C: Hgb A1c MFr Bld: 5.7 % (ref 4.6–6.5)

## 2024-04-03 NOTE — Patient Instructions (Addendum)
 Thank you for visiting [facility] today! Here's what we talked about: - Wean off testosterone : take your shot once a month x2 months then stop completely. - BP goal <140/90. Take it everyday and bring readings

## 2024-04-03 NOTE — Progress Notes (Signed)
 Subjective:    Patient ID: EUDELL MCPHEE, male    DOB: [birth year], 43 y.o.   MRN: [id number]   Macyn Remmert Gonsalez is a very pleasant 43 y.o. male who presents today follow up. Wants to discuss BP goals 130s/ Pain in leg - Been on testosterone  x71yr, no prior anabolic teroid use, several yrs ago took OTC supplement that caused him to bulk up but did not do it for sa long period of time. Never had any sx at the time his levels checked. Works as a IT sales professional, pain in quad times several weeks, intermittent but occurring more and more often, feels like an aching soreness, about 3 or 4 out of 10 severity, says left leg does feel weak sometimes, but denies noticing any muscle knots, or muscle stiffness.  Does endorse occasional lower back and lateral hip pain but says the quad pain bothers him the most.   Review of Systems  All other systems reviewed and are negative.       No Known Allergies  Current Outpatient Medications on File Prior to Visit  Medication Sig Dispense Refill   amLODipine  (NORVASC ) 5 MG tablet TAKE 1 TABLET (5 MG TOTAL) BY MOUTH DAILY. 90 tablet 3   cyclobenzaprine  (FLEXERIL ) 10 MG tablet TAKE 1/2 TO 1 TABLET (5-10 MG TOTAL) BY MOUTH EVERY DAY AT BEDTIME AS NEEDED FOR MUSCLE SPASM 30 tablet 1   omeprazole  (PRILOSEC) 40 MG capsule Take 40 mg by mouth daily.     SYRINGE-NEEDLE, DISP, 3 ML (SAFETY SYRINGE/NEEDLE) 21G X 1-1/2 3 ML MISC 1 each by Does not apply route every 14 (fourteen) days. 2 each 12   testosterone  cypionate (DEPOTESTOSTERONE CYPIONATE) 200 MG/ML injection INJECT 0.5-1 MLS (100-200 MG TOTAL) INTO THE MUSCLE EVERY 14 (FOURTEEN) DAYS. 2 mL 4   No current facility-administered medications on file prior to visit.    BP 136/86   Pulse 64   Temp 97.6 F (36.4 C) (Oral)   Ht 6' 4 (1.93 m)   Wt (!) 316 lb 12.8 oz (143.7 kg)   SpO2 99%   BMI 38.56 kg/m   Objective:    Physical Exam Vitals and nursing note reviewed.  Constitutional:      Appearance:  Normal appearance.  HENT:     Head: Normocephalic and atraumatic.  Eyes:     Extraocular Movements: Extraocular movements intact.     Conjunctiva/sclera: Conjunctivae normal.  Musculoskeletal:     Right hip: No bony tenderness. Normal range of motion. Normal strength.     Left hip: No bony tenderness. Normal range of motion. Normal strength.     Right upper leg: No deformity, tenderness or bony tenderness.     Left upper leg: Tenderness (Over left lateral quad) present. No swelling, deformity or bony tenderness.       Legs:     Comments: No tenderness over the iliotibial band  Skin:    General: Skin is warm.  Neurological:     Mental Status: He is alert.  Psychiatric:        Mood and Affect: Mood normal.        Behavior: Behavior normal.            Assessment & Plan:   1. Encounter for monitoring testosterone  replacement therapy (Primary) Of note labs from 1 year ago did show low free testosterone  levels at 180 ng/dL, however, at that time patient was completely asymptomatic.  Given that he has no complaints of unusual levels of fatigue,  sexual dysfunction, low mood, headaches or vision changes, moreover, patient denies any history of prolonged anabolic steroid use, there does not appear to be a strong clinical indication for supplemental testosterone . Thoroughly discussed with patient potential risks of supplemental testosterone  therapy which grandma limited to increased hematocrit, risk of DVT and dyslipidemia.  Will taper him off gradually to prevent withdrawal symptoms, counseled patient on taper schedule and how to monitor for withdrawal symptoms.  Will repeat testosterone  level, CBC and lipid panel as part of monitoring on this therapy. Will provide patient with diet and lifestyle changes he can incorporate to naturally increase his testosterone .    - Testosterone  Total,Free,Bio, Males - Lipid Panel - CBC with Differential  2. Numbness and tingling Reportedly a chronic  problem, he has reportedly been diagnosed with neuropathy, unclear etiology at this time, will rule out diabetes and vitamin deficiencies as a potential cause and go from there.  - Hemoglobin A1c - Vitamin B6 - Vitamin B12  3. Strain of left quadriceps muscle, initial encounter Clinical exam is largely benign, notable for tenderness, see objective section above.  Will refer to physical therapy as below.  - Ambulatory referral to Physical Therapy   Return in about 2 weeks (around 04/17/2024) for CPE.   Emerly Prak K Maryori Weide, MD  04/03/24

## 2024-04-04 ENCOUNTER — Other Ambulatory Visit: Payer: Self-pay | Admitting: *Deleted

## 2024-04-04 NOTE — Telephone Encounter (Signed)
 Last filled on 09/07/23 #2 mL/ 4 refills   Pt had a TOC with Dr. Bennett on 04/03/24

## 2024-04-05 ENCOUNTER — Telehealth: Payer: Self-pay

## 2024-04-05 DIAGNOSIS — Z5181 Encounter for therapeutic drug level monitoring: Secondary | ICD-10-CM

## 2024-04-05 NOTE — Telephone Encounter (Signed)
 Erroneous encounter

## 2024-04-05 NOTE — Telephone Encounter (Signed)
 Re-ordered testosterone  for more accurate result

## 2024-04-08 LAB — TEST AUTHORIZATION

## 2024-04-08 LAB — TESTOSTERONE TOTAL,FREE,BIO, MALES
Albumin: 4.6 g/dL (ref 3.6–5.1)
Albumin: 4.7 g/dL (ref 3.6–5.1)
Sex Hormone Binding: 13 nmol/L (ref 10–50)
Sex Hormone Binding: 14 nmol/L (ref 10–50)
Testosterone: 129 ng/dL — ABNORMAL LOW (ref 250–827)
Testosterone: 140 ng/dL — ABNORMAL LOW (ref 250–827)

## 2024-04-08 LAB — VITAMIN B6: Vitamin B6: 19.1 ng/mL (ref 2.1–21.7)

## 2024-04-09 MED ORDER — TESTOSTERONE CYPIONATE 200 MG/ML IM SOLN: INTRAMUSCULAR | 0 refills | Status: DC

## 2024-04-18 ENCOUNTER — Encounter: Payer: Self-pay | Admitting: Podiatry

## 2024-04-24 ENCOUNTER — Encounter

## 2024-05-15 ENCOUNTER — Encounter

## 2024-05-27 ENCOUNTER — Ambulatory Visit: Admitting: Podiatry

## 2024-05-27 DIAGNOSIS — S86311A Strain of muscle(s) and tendon(s) of peroneal muscle group at lower leg level, right leg, initial encounter: Secondary | ICD-10-CM | POA: Diagnosis not present

## 2024-05-27 DIAGNOSIS — M7989 Other specified soft tissue disorders: Secondary | ICD-10-CM | POA: Diagnosis not present

## 2024-05-27 DIAGNOSIS — M7731 Calcaneal spur, right foot: Secondary | ICD-10-CM

## 2024-05-27 NOTE — Progress Notes (Signed)
 He presents today for a reciting of his consent form regarding his right ankle.  He states that his foot is really not any worse but it has not gotten any better states that it is sore after a lot of activity.  No changes in his past medical history medications allergies surgeries and social history.  Objective: Vital signs are stable alert oriented x 3 large soft nonpulsatile mass extending from the posterior lateral malleolus to the fifth metatarsal base.  He has tenderness on palpation of the peroneal tendons.  And the area is warm to the touch.  The mass actually looks larger to me.  Assessment: Lateral calcaneal exostosis peroneal tendon tears and soft tissue mass lateral ankle right.  Plan: Consented him today for soft tissue mass excision repair of the peroneal tendons.  And PRP injection with a cast.  Answered all the questions regarding this procedure to the best my ability in layman's terms.  He understands to be nonweightbearing for a period of time utilizing knee scooter or crutches.  And probably will not be able to return to work for about 3 months.  He signedthe consent form once again.

## 2024-06-18 ENCOUNTER — Telehealth: Payer: Self-pay | Admitting: Podiatry

## 2024-06-18 NOTE — Telephone Encounter (Signed)
 Called and patient is scheduled for surgery on 06/28/2024. Patient not on any blood thinners or GLP1 medications and patient pharmacy correct in chart.

## 2024-06-18 NOTE — Telephone Encounter (Signed)
 DOS- 06/28/2024  EXC GANGLION/TUMOR RT- 71909 REPAIR TENDON RT- 71913 CALCANEAL OSTECTOMY (POSTERIOR) RT- 28118  [facility] EFFECTIVE DATE- 07/26/2023  DEDUCTIBLE- $500 REMAINING- $464.20 OOP- $2500 REMAINING- $2021.64 FAMILY DEDUCTIBLE- $1000 REMAINING- $964.20 FAMILY OOP- $5000 REMAINING- $4521.64 COINSURANCE- 20%  PER [employer] PORTAL NO PRIOR AUTHS ARE REQUIRED FOR CPT CODES 71909, [id number], AND 28118. DECISION ID# [id number]

## 2024-06-26 ENCOUNTER — Other Ambulatory Visit: Payer: Self-pay | Admitting: Podiatry

## 2024-06-26 ENCOUNTER — Telehealth: Payer: Self-pay

## 2024-06-26 MED ORDER — OXYCODONE-ACETAMINOPHEN 10-325 MG PO TABS: 1.0000 | ORAL_TABLET | Freq: Three times a day (TID) | ORAL | 0 refills | Status: AC | PRN

## 2024-06-26 MED ORDER — ONDANSETRON HCL 4 MG PO TABS: 4.0000 mg | ORAL_TABLET | Freq: Three times a day (TID) | ORAL | 0 refills | Status: DC | PRN

## 2024-06-26 MED ORDER — CEPHALEXIN 500 MG PO CAPS: 500.0000 mg | ORAL_CAPSULE | Freq: Three times a day (TID) | ORAL | 0 refills | Status: DC

## 2024-06-26 NOTE — Telephone Encounter (Signed)
 Cannot take patient at this time

## 2024-06-26 NOTE — Telephone Encounter (Signed)
 Copied from CRM [id number]. Topic: Appointments - Transfer of Care >> Jun 26, 2024  9:34 AM Treva T wrote: Pt is requesting to transfer FROM: Dr. Bennett Pt is requesting to transfer TO: Dr. Randeen Reason for requested transfer: pt requested It is the responsibility of the team the patient would like to transfer to (Dr. Randeen) to reach out to the patient if for any reason this transfer is not acceptable.

## 2024-06-26 NOTE — Telephone Encounter (Signed)
 Dr. Randeen see pt's wife's mychart regarding him

## 2024-06-26 NOTE — Telephone Encounter (Signed)
 I called and left a voicemail for pt to be scheduled.

## 2024-06-26 NOTE — Telephone Encounter (Signed)
 Sorry- I forgot talking about that with his wife ,  it is fine to switch  Thanks

## 2024-06-28 DIAGNOSIS — S86311A Strain of muscle(s) and tendon(s) of peroneal muscle group at lower leg level, right leg, initial encounter: Secondary | ICD-10-CM | POA: Diagnosis not present

## 2024-06-28 HISTORY — PX: TENDON REPAIR: SHX5111

## 2024-07-01 ENCOUNTER — Encounter

## 2024-07-03 ENCOUNTER — Encounter: Admitting: Podiatry

## 2024-07-03 ENCOUNTER — Ambulatory Visit (INDEPENDENT_AMBULATORY_CARE_PROVIDER_SITE_OTHER)

## 2024-07-03 ENCOUNTER — Ambulatory Visit (INDEPENDENT_AMBULATORY_CARE_PROVIDER_SITE_OTHER): Admitting: Podiatry

## 2024-07-03 VITALS — Ht 76.0 in | Wt 316.8 lb

## 2024-07-03 DIAGNOSIS — S86311A Strain of muscle(s) and tendon(s) of peroneal muscle group at lower leg level, right leg, initial encounter: Secondary | ICD-10-CM

## 2024-07-03 NOTE — Progress Notes (Signed)
°  Subjective:  Patient ID: Melvin Hill, male    DOB: 08/24/1980,  MRN: [id number]  Chief Complaint  Patient presents with   Routine Post Op    POV #1 DOS12/5/25 RT EXC. GANGLION/TUMOR, RT REPAIR TENDON, RT CALACANEAL OSTECTOMY (POSTERIOR). He has no complaints or concerns        43 y.o. male returns for post-op check.    Review of Systems: Negative except as noted in the HPI. Denies N/V/F/Ch.   Objective:  There were no vitals filed for this visit. Body mass index is 38.56 kg/m. Constitutional Well developed. Well nourished.  Vascular Foot warm and well perfused. Capillary refill normal to all digits.  Calf is soft and supple, no posterior calf or knee pain, negative Homans' sign  Neurologic Normal speech. Oriented to person, place, and time. Epicritic sensation to light touch grossly present bilaterally.  Dermatologic Cast is clean dry and intact.  Good range of motion of digits.  Sensation intact.  Orthopedic: Tenderness to palpation noted about the surgical site.     Multiple view plain film radiographs: Calcaneus normal in appearance at site of ostectomy Assessment:   1. Tear of peroneal tendon, right, initial encounter    Plan:  Patient was evaluated and treated and all questions answered.  S/p foot surgery right -Progressing as expected post-operatively. -XR: Noted above no issues -WB Status: Nonweightbearing in cast with crutches -Sutures: Return in 2 weeks for cast change and suture and staple removal   -Medications: No refills required -Foot redressed.  No follow-ups on file.

## 2024-07-08 ENCOUNTER — Encounter: Payer: Self-pay | Admitting: Family Medicine

## 2024-07-08 ENCOUNTER — Ambulatory Visit: Admitting: Family Medicine

## 2024-07-08 VITALS — BP 128/86 | HR 58 | Temp 97.7°F | Ht 76.0 in | Wt 312.4 lb

## 2024-07-08 DIAGNOSIS — Z0000 Encounter for general adult medical examination without abnormal findings: Secondary | ICD-10-CM | POA: Diagnosis not present

## 2024-07-08 DIAGNOSIS — R7303 Prediabetes: Secondary | ICD-10-CM | POA: Insufficient documentation

## 2024-07-08 DIAGNOSIS — G629 Polyneuropathy, unspecified: Secondary | ICD-10-CM | POA: Diagnosis not present

## 2024-07-08 DIAGNOSIS — E349 Endocrine disorder, unspecified: Secondary | ICD-10-CM | POA: Diagnosis not present

## 2024-07-08 DIAGNOSIS — E786 Lipoprotein deficiency: Secondary | ICD-10-CM | POA: Diagnosis not present

## 2024-07-08 DIAGNOSIS — I10 Essential (primary) hypertension: Secondary | ICD-10-CM | POA: Diagnosis not present

## 2024-07-08 DIAGNOSIS — Z8719 Personal history of other diseases of the digestive system: Secondary | ICD-10-CM | POA: Diagnosis not present

## 2024-07-08 DIAGNOSIS — K219 Gastro-esophageal reflux disease without esophagitis: Secondary | ICD-10-CM | POA: Diagnosis not present

## 2024-07-08 DIAGNOSIS — K581 Irritable bowel syndrome with constipation: Secondary | ICD-10-CM

## 2024-07-08 DIAGNOSIS — K589 Irritable bowel syndrome without diarrhea: Secondary | ICD-10-CM | POA: Insufficient documentation

## 2024-07-08 NOTE — Patient Instructions (Addendum)
 Take vitamin D3 at least 1000 international units daily    Avoid added sugars in your diet when you can  Try to get most of your carbohydrates from produce (with the exception of white potatoes) and whole grains Eat less bread/pasta/rice/snack foods/cereals/sweets and other items from the middle of the grocery store (processed carbs)  Take 500 mcg of vitamin B12 daily   Stay off the testosterone   See how you feel   Take the omeprazole  only as needed

## 2024-07-08 NOTE — Progress Notes (Signed)
 Subjective:    Patient ID: Melvin Hill, male    DOB: [birth year], 43 y.o.   MRN: [id number]  HPI  Here for health maintenance exam and to review chronic medical problems  Here to transfer care from Dr Bennett Wife is a patient of mine   [employer] from Last 3 Encounters:  07/08/24 (!) 312 lb 6 oz (141.7 kg)  07/03/24 (!) 316 lb 12.8 oz (143.7 kg)  04/03/24 (!) 316 lb 12.8 oz (143.7 kg)   38.02 kg/m  Vitals:   07/08/24 1053  BP: 128/86  Pulse: (!) 58  Temp: 97.7 F (36.5 C)  SpO2: 99%    Immunization History  Administered Date(s) Administered   Influenza Split 05/03/2011   Influenza-Unspecified 03/26/2024   Moderna Sars-Covid-2 Vaccination 08/26/2020   Unspecified SARS-COV-2 Vaccination 09/23/2019, 10/24/2019    Health Maintenance Due  Topic Date Due   DTaP/Tdap/Td (1 - Tdap) Never done   Hepatitis B Vaccines 19-59 Average Risk (1 of 3 - 19+ 3-dose series) Never done   HPV VACCINES (1 - 3-dose SCDM series) Never done   COVID-19 Vaccine (4 - 2025-26 season) 03/25/2024    In a cast Had surgery on right ankle - tendon and bone spur  Using knee scooter  Hopes to get cast off 12/24    Prostate health No family history  No voiding issues  Occational nocturia - once    Testosterone  -takes depo 100-200 mg monthly -tapered/is off of now  Lab from 1 year ago noted low free testosterone  at 180 ng/dL Was not symptomatic  Felt a little more energetic with it    Colon cancer screening -no family history   Bone health   Falls-none  Fractures-none  Supplements - milk    Exercise  Weight lifting  Good exercise  Likes to go to gym  3 times per week Has run 1/2 marathons in the past   Drinks milk for ca and D   Will return to running when he can    Mood    07/08/2024   11:03 AM 04/03/2024    8:10 AM 04/04/2023    7:45 AM 03/01/2022   10:13 AM 11/18/2019    3:37 PM  Depression screen PHQ 2/9  Decreased Interest 0 0 0 0 0  Down, Depressed, Hopeless 0  0 0 0 0  PHQ - 2 Score 0 0 0 0 0  Altered sleeping 0 0   0  Tired, decreased energy 0 0   0  Change in appetite 0 0   0  Feeling bad or failure about yourself  0 0   0  Trouble concentrating 0 0   0  Moving slowly or fidgety/restless 0 0   0  Suicidal thoughts 0 0   0  PHQ-9 Score 0 0    0   Difficult doing work/chores Not difficult at all Not difficult at all   Not difficult at all     Data saved with a previous flowsheet row definition   Needs health maintenance exam  History of HTN GERD Testosterone   History of neuropathy - used to take nortriptyline/ also gabapentin and duloxetine   HTN bp is stable today  No cp or palpitations or headaches or edema  No side effects to medicines  BP Readings from Last 3 Encounters:  04/03/24 136/86  04/04/23 124/84  01/06/23 127/80     Lab Results  Component Value Date   NA 139 04/04/2023   K 4.5  04/04/2023   CO2 28 04/04/2023   GLUCOSE 90 04/04/2023   BUN 22 04/04/2023   CREATININE 1.02 04/04/2023   CALCIUM 9.4 04/04/2023   GFR 91.73 04/04/2023   GFRNONAA 91 10/05/2022   Amlodipine  5 mg daily    GERD and gastritis  Few years  EGD and colonoscopy - with [facility] On omeprazole  40 mg daily    Take a supplement with digestive enzymes - has lipase in it ?  Is helpful   Watches what he eats   Had EGD at [facility] several years ago  Gastritis  Last omeprazole  2-3 weeks ago  Symptom free most of the time  Tries not to take nsaids  (took just 3 doses after ankle surgery)   Has IBS with constipation as well     Neuropathy  Saw Dr Lane in past  Gabapentin , cymbalta, nortriptyline in past  Tolerates it w/o medication now  Was tested and had imaging/no cause found    Lab Results  Component Value Date   HGBA1C 5.7 04/03/2024   HGBA1C 5.4 11/18/2020   Lab Results  Component Value Date   VITAMINB12 215 04/03/2024  B6  Cholesterol Lab Results  Component Value Date   CHOL 164 04/03/2024   CHOL  178 03/01/2022   Lab Results  Component Value Date   HDL 36.70 (L) 04/03/2024   HDL 38.40 (L) 03/01/2022   Lab Results  Component Value Date   LDLCALC 100 (H) 04/03/2024   LDLCALC 109 (H) 03/01/2022   Lab Results  Component Value Date   TRIG 138.0 04/03/2024   TRIG 152.0 (H) 03/01/2022   Lab Results  Component Value Date   CHOLHDL 4 04/03/2024   CHOLHDL 5 03/01/2022   No results found for: LDLDIRECT  Lab Results  Component Value Date   ALT 32 04/04/2023   AST 26 04/04/2023   ALKPHOS 53 04/04/2023   BILITOT 0.6 04/04/2023     Lab Results  Component Value Date   WBC 6.1 04/03/2024   HGB 15.5 04/03/2024   HCT 45.4 04/03/2024   MCV 87.8 04/03/2024   PLT 262.0 04/03/2024   Lab Results  Component Value Date   TSH 1.52 11/18/2020       Patient Active Problem List   Diagnosis Date Noted   Low HDL (under 40) 07/08/2024   IBS (irritable bowel syndrome) 07/08/2024   H/O gastritis 07/08/2024   Prediabetes 07/08/2024   Routine general medical examination at a health care facility 07/08/2024   Testosterone  deficiency 07/08/2024   Numbness and tingling 11/17/2021   Generalized anxiety disorder 03/04/2021   Essential hypertension, benign 01/15/2021   Neuropathy 11/18/2020   Gastroesophageal reflux disease 03/09/2020   Past Medical History:  Diagnosis Date   History of chicken pox    Obesity    Past Surgical History:  Procedure Laterality Date   ANTERIOR CRUCIATE LIGAMENT REPAIR  07/26/1999   Left knee   VASECTOMY  09/2021   Social History[1] Family History  Problem Relation Age of Onset   Diabetes Father    Diabetes Maternal Grandfather    Stroke Maternal Grandfather    Diabetes Maternal Grandmother    Coronary artery disease Maternal Grandmother    Cancer Neg Hx    Hypertension Neg Hx    Allergies[2] Medications Ordered Prior to Encounter[3]  Review of Systems  Constitutional:  Negative for activity change, appetite change, fatigue, fever  and unexpected weight change.  HENT:  Negative for congestion, rhinorrhea, sore throat and  trouble swallowing.   Eyes:  Negative for pain, redness, itching and visual disturbance.  Respiratory:  Negative for cough, chest tightness, shortness of breath and wheezing.   Cardiovascular:  Negative for chest pain and palpitations.  Gastrointestinal:  Negative for abdominal pain, blood in stool, constipation, diarrhea and nausea.  Endocrine: Negative for cold intolerance, heat intolerance, polydipsia and polyuria.  Genitourinary:  Negative for difficulty urinating, dysuria, frequency and urgency.  Musculoskeletal:  Positive for arthralgias. Negative for joint swelling and myalgias.  Skin:  Negative for pallor and rash.  Neurological:  Negative for dizziness, tremors, weakness, numbness and headaches.       Tingling in feet    Cast on right ankle s/p surgery  Hematological:  Negative for adenopathy. Does not bruise/bleed easily.  Psychiatric/Behavioral:  Negative for decreased concentration and dysphoric mood. The patient is not nervous/anxious.        Objective:   Physical Exam Constitutional:      General: He is not in acute distress.    Appearance: Normal appearance. He is well-developed. He is obese. He is not ill-appearing or diaphoretic.     Comments: Muscular build   HENT:     Head: Normocephalic and atraumatic.     Right Ear: Tympanic membrane, ear canal and external ear normal.     Left Ear: Tympanic membrane, ear canal and external ear normal.     Nose: Nose normal. No congestion.     Mouth/Throat:     Mouth: Mucous membranes are moist.     Pharynx: Oropharynx is clear. No posterior oropharyngeal erythema.  Eyes:     General: No scleral icterus.       Right eye: No discharge.        Left eye: No discharge.     Conjunctiva/sclera: Conjunctivae normal.     Pupils: Pupils are equal, round, and reactive to light.  Neck:     Thyroid : No thyromegaly.     Vascular: No carotid  bruit or JVD.  Cardiovascular:     Rate and Rhythm: Normal rate and regular rhythm.     Pulses: Normal pulses.     Heart sounds: Normal heart sounds.     No gallop.  Pulmonary:     Effort: Pulmonary effort is normal. No respiratory distress.     Breath sounds: Normal breath sounds. No wheezing or rales.     Comments: Good air exch Chest:     Chest wall: No tenderness.  Abdominal:     General: Bowel sounds are normal. There is no distension or abdominal bruit.     Palpations: Abdomen is soft. There is no mass.     Tenderness: There is no abdominal tenderness.     Hernia: No hernia is present.  Musculoskeletal:        General: No tenderness.     Cervical back: Normal range of motion and neck supple. No rigidity. No muscular tenderness.     Right lower leg: No edema.     Left lower leg: No edema.     Comments: Cast on right ankle   Lymphadenopathy:     Cervical: No cervical adenopathy.  Skin:    General: Skin is warm and dry.     Coloration: Skin is not pale.     Findings: No erythema or rash.     Comments: Solar lentigines diffusely   Neurological:     Mental Status: He is alert.     Cranial Nerves: No cranial nerve deficit.  Motor: No abnormal muscle tone.     Coordination: Coordination normal.     Deep Tendon Reflexes: Reflexes are normal and symmetric. Reflexes normal.     Comments: Using knee scooter   Psychiatric:        Mood and Affect: Mood normal.        Cognition and Memory: Cognition and memory normal.           Assessment & Plan:   Problem List Items Addressed This Visit       Cardiovascular and Mediastinum   Essential hypertension, benign   bp in fair control at this time  BP Readings from Last 1 Encounters:  07/08/24 128/86   No changes needed Most recent labs reviewed  Disc lifstyle change with low sodium diet and exercise  Amlodipine  5 mg daily        Digestive   IBS (irritable bowel syndrome)   Constipation predominant  Encouraged  produce and good water intake  Sent for colonocopy report from [facility]       Gastroesophageal reflux disease   Took omeprazole  40 mg daily for this and gastritis in the past Now just prn  Doing well  Avoids triggers   Sent for EGD from [facility]         Nervous and Auditory   Neuropathy   Saw neuro in past/ Dr Lane  Tolerates symptoms   No longer on gabapentin /cymbalta or nortriptyline        Other   Testosterone  deficiency   Replacement in past  Off of it now   Not symptomatic enough to be worth treatment   Will refer to urology if symptoms worsen      Routine general medical examination at a health care facility - Primary   Reviewed health habits including diet and exercise and skin cancer prevention Reviewed appropriate screening tests for age  Also reviewed health mt list, fam hx and immunization status , as well as social and family history   See HPI Labs reviewed and ordered Health Maintenance  Topic Date Due   DTaP/Tdap/Td vaccine (1 - Tdap) Never done   Hepatitis B Vaccine (1 of 3 - 19+ 3-dose series) Never done   HPV Vaccine (1 - 3-dose SCDM series) Never done   COVID-19 Vaccine (4 - 2025-26 season) 03/25/2024   Flu Shot  Completed   Hepatitis C Screening  Completed   HIV Screening  Completed   Pneumococcal Vaccine  Aged Out   Meningitis B Vaccine  Aged Out    No prostate concerns Stopped testosterone   Will discuss colon cancer screen at 46 Discussed fall prevention, supplements and exercise for bone density  PHQ 0       Prediabetes   Lab Results  Component Value Date   HGBA1C 5.7 04/03/2024   HGBA1C 5.4 11/18/2020   disc imp of low glycemic diet and wt loss to prevent DM2       Low HDL (under 40)   HDL 36.7  Disc goals for lipids and reasons to control them Rev last labs with pt Rev low sat fat diet in detail  Encouraged exercise and healthy diet       H/O gastritis   See a/p for GERD Prn ppi Avoids  nsaids          [1]  Social History Tobacco Use   Smoking status: Never    Passive exposure: Never   Smokeless tobacco: Never  Vaping Use  Vaping status: Never Used  Substance Use Topics   Alcohol use: Yes    Comment: Very rare   Drug use: No  [2] No Known Allergies [3]  Current Outpatient Medications on File Prior to Visit  Medication Sig Dispense Refill   amLODipine  (NORVASC ) 5 MG tablet TAKE 1 TABLET (5 MG TOTAL) BY MOUTH DAILY. 90 tablet 3   cephALEXin  (KEFLEX ) 500 MG capsule Take 1 capsule (500 mg total) by mouth 3 (three) times daily. 30 capsule 0   cyclobenzaprine  (FLEXERIL ) 10 MG tablet TAKE 1/2 TO 1 TABLET (5-10 MG TOTAL) BY MOUTH EVERY DAY AT BEDTIME AS NEEDED FOR MUSCLE SPASM 30 tablet 1   omeprazole  (PRILOSEC) 40 MG capsule Take 40 mg by mouth daily. (Patient taking differently: Take 40 mg by mouth daily. As needed)     ondansetron  (ZOFRAN ) 4 MG tablet Take 1 tablet (4 mg total) by mouth every 8 (eight) hours as needed. 20 tablet 0   SYRINGE-NEEDLE, DISP, 3 ML (SAFETY SYRINGE/NEEDLE) 21G X 1-1/2 3 ML MISC 1 each by Does not apply route every 14 (fourteen) days. 2 each 12   No current facility-administered medications on file prior to visit.

## 2024-07-08 NOTE — Assessment & Plan Note (Signed)
 Took omeprazole  40 mg daily for this and gastritis in the past Now just prn  Doing well  Avoids triggers   Sent for EGD from [facility]

## 2024-07-08 NOTE — Assessment & Plan Note (Signed)
 Reviewed health habits including diet and exercise and skin cancer prevention Reviewed appropriate screening tests for age  Also reviewed health mt list, fam hx and immunization status , as well as social and family history   See HPI Labs reviewed and ordered Health Maintenance  Topic Date Due   DTaP/Tdap/Td vaccine (1 - Tdap) Never done   Hepatitis B Vaccine (1 of 3 - 19+ 3-dose series) Never done   HPV Vaccine (1 - 3-dose SCDM series) Never done   COVID-19 Vaccine (4 - 2025-26 season) 03/25/2024   Flu Shot  Completed   Hepatitis C Screening  Completed   HIV Screening  Completed   Pneumococcal Vaccine  Aged Out   Meningitis B Vaccine  Aged Out    No prostate concerns Stopped testosterone   Will discuss colon cancer screen at 46 Discussed fall prevention, supplements and exercise for bone density  PHQ 0

## 2024-07-08 NOTE — Assessment & Plan Note (Signed)
 Lab Results  Component Value Date   HGBA1C 5.7 04/03/2024   HGBA1C 5.4 11/18/2020   disc imp of low glycemic diet and wt loss to prevent DM2

## 2024-07-08 NOTE — Assessment & Plan Note (Signed)
 See a/p for GERD Prn ppi Avoids nsaids

## 2024-07-08 NOTE — Assessment & Plan Note (Signed)
 Saw neuro in past/ Dr Lane  Tolerates symptoms   No longer on gabapentin /cymbalta or nortriptyline

## 2024-07-08 NOTE — Assessment & Plan Note (Signed)
 bp in fair control at this time  BP Readings from Last 1 Encounters:  07/08/24 128/86   No changes needed Most recent labs reviewed  Disc lifstyle change with low sodium diet and exercise  Amlodipine  5 mg daily

## 2024-07-08 NOTE — Assessment & Plan Note (Signed)
 Constipation predominant  Encouraged produce and good water intake  Sent for colonocopy report from [facility]

## 2024-07-08 NOTE — Assessment & Plan Note (Signed)
 HDL 36.7  Disc goals for lipids and reasons to control them Rev last labs with pt Rev low sat fat diet in detail  Encouraged exercise and healthy diet

## 2024-07-08 NOTE — Assessment & Plan Note (Signed)
 Replacement in past  Off of it now   Not symptomatic enough to be worth treatment   Will refer to urology if symptoms worsen

## 2024-07-10 ENCOUNTER — Encounter: Payer: Self-pay | Admitting: Podiatry

## 2024-07-15 ENCOUNTER — Ambulatory Visit: Admitting: Podiatry

## 2024-07-15 VITALS — Ht 76.0 in | Wt 312.4 lb

## 2024-07-15 DIAGNOSIS — S86311D Strain of muscle(s) and tendon(s) of peroneal muscle group at lower leg level, right leg, subsequent encounter: Secondary | ICD-10-CM

## 2024-07-16 ENCOUNTER — Encounter: Payer: Self-pay | Admitting: Podiatry

## 2024-07-16 NOTE — Progress Notes (Signed)
"  °  Subjective:  Patient ID: Melvin Hill, male    DOB: May 01, 1981,  MRN: [id number]  Chief Complaint  Patient presents with   Post-op Follow-up    RM 8 POV #2 DOS12/5/25 RT EXC. GANGLION/TUMOR, RT REPAIR TENDON, RT CALACANEAL OSTECTOMY (POSTERIOR) (HYATT)       43 y.o. male returns for post-op check.    Review of Systems: Negative except as noted in the HPI. Denies N/V/F/Ch.   Objective:  There were no vitals filed for this visit. Body mass index is 38.02 kg/m. Constitutional Well developed. Well nourished.  Vascular Foot warm and well perfused. Capillary refill normal to all digits.  Calf is soft and supple, no posterior calf or knee pain, negative Homans' sign  Neurologic Normal speech. Oriented to person, place, and time. Epicritic sensation to light touch grossly present bilaterally.  Dermatologic Cast is clean dry and intact.  Good range of motion of digits.  Sensation intact.  Incision is well-healed without signs of infection  Orthopedic: Tenderness to palpation noted about the surgical site.     Multiple view plain film radiographs: Calcaneus normal in appearance at site of ostectomy Assessment:   1. Tear of peroneal tendon, right, subsequent encounter    Plan:  Patient was evaluated and treated and all questions answered.  Incision healing well.  Sutures and staples removed.  Cast change was performed with removal of today's cast which was still clean, showed no signs of weightbearing and good compliance with regimen.  New well-padded below the knee fiberglass cast was reapplied.  Continue nonweightbearing.  Follow-up as scheduled with Dr. Verta.  I expect he will be able to return to weightbearing in a boot after this he would like to purchase his own boot on [employer] and will bring it to his appointment. No follow-ups on file.  "

## 2024-07-17 ENCOUNTER — Encounter: Admitting: Podiatry

## 2024-07-20 ENCOUNTER — Encounter: Payer: Self-pay | Admitting: Podiatry

## 2024-07-22 NOTE — Telephone Encounter (Signed)
 Patient scheduling appointment to have cast changed.

## 2024-07-23 NOTE — Telephone Encounter (Signed)
"  Scheduled 07/24/2024  "

## 2024-07-24 ENCOUNTER — Ambulatory Visit

## 2024-07-24 DIAGNOSIS — S86311D Strain of muscle(s) and tendon(s) of peroneal muscle group at lower leg level, right leg, subsequent encounter: Secondary | ICD-10-CM | POA: Diagnosis not present

## 2024-07-24 NOTE — Progress Notes (Signed)
 Patient presented to clinic today for right cast change. He states that the last cast was causing some minor rubbing dorsally and was pistoning. His cast was removed, incision had triple antibiotic ointment and gauze applied, and then a well padded cast with the ankle held at 90 degrees was applied. He expressed satisfaction with the tightness and fit following application. The incision was well approximated without any sign of complication.  Prentice Ovens, DPM

## 2024-07-30 ENCOUNTER — Ambulatory Visit: Admitting: Family Medicine

## 2024-07-30 ENCOUNTER — Encounter: Payer: Self-pay | Admitting: Family Medicine

## 2024-07-30 VITALS — BP 118/82 | HR 77 | Temp 97.8°F | Resp 16 | Ht 75.0 in | Wt 312.0 lb

## 2024-07-30 DIAGNOSIS — J0100 Acute maxillary sinusitis, unspecified: Secondary | ICD-10-CM | POA: Diagnosis not present

## 2024-07-30 MED ORDER — AMOXICILLIN-POT CLAVULANATE 875-125 MG PO TABS: 1.0000 | ORAL_TABLET | Freq: Two times a day (BID) | ORAL | 0 refills | Status: DC

## 2024-07-30 MED ORDER — FLUTICASONE PROPIONATE 50 MCG/ACT NA SUSP: 2.0000 | Freq: Every day | NASAL | 1 refills | Status: AC

## 2024-07-30 NOTE — Progress Notes (Signed)
 "  Subjective:    Patient ID: Melvin Hill, male    DOB: 10/11/1980, 44 y.o.   MRN: [id number]  HPI  Wt Readings from Last 3 Encounters:  07/30/24 (!) 312 lb (141.5 kg)  07/15/24 (!) 312 lb 6.1 oz (141.7 kg)  07/08/24 (!) 312 lb 6 oz (141.7 kg)   39.00 kg/m  Vitals:   07/30/24 1237  BP: 118/82  Pulse: 77  Resp: 16  Temp: 97.8 F (36.6 C)  SpO2: 99%     Pt presents for  Sinus symptoms   Started 3 weeks ago  Like a regular cold Got better and then worse   Pressure around and between eyes  Ears hurt on and off/worse at night  Ringing in ears  Headaches come and go  Congestion  Clear to green mucous  No bleeding  No cough  No fever now or early on  Throat feels ok (was sore at the beginning)   Took amoxicillin  - 3-4 days, did not help   Over the counter Sudafed - works temporarily  Afrin at first few days  Has not used saline  No steroid nasal sprays   No history of sinus surgery    Patient Active Problem List   Diagnosis Date Noted   Low HDL (under 40) 07/08/2024   IBS (irritable bowel syndrome) 07/08/2024   H/O gastritis 07/08/2024   Prediabetes 07/08/2024   Routine general medical examination at a health care facility 07/08/2024   Testosterone  deficiency 07/08/2024   Acute sinusitis 08/02/2022   Numbness and tingling 11/17/2021   Generalized anxiety disorder 03/04/2021   Essential hypertension, benign 01/15/2021   Neuropathy 11/18/2020   Gastroesophageal reflux disease 03/09/2020   Past Medical History:  Diagnosis Date   History of chicken pox    Obesity    Past Surgical History:  Procedure Laterality Date   ANTERIOR CRUCIATE LIGAMENT REPAIR  07/26/1999   Left knee   TENDON REPAIR  06/28/2024   VASECTOMY  09/2021   Social History[1] Family History  Problem Relation Age of Onset   Diabetes Father    Diabetes Maternal Grandfather    Stroke Maternal Grandfather    Diabetes Maternal Grandmother    Coronary artery disease Maternal  Grandmother    Cancer Neg Hx    Hypertension Neg Hx    Allergies[2] Medications Ordered Prior to Encounter[3]  Review of Systems  Constitutional:  Negative for appetite change, fatigue and fever.  HENT:  Positive for congestion, ear pain, postnasal drip, rhinorrhea, sinus pressure, sore throat and tinnitus. Negative for nosebleeds, sinus pain, trouble swallowing and voice change.   Eyes:  Negative for pain, redness and itching.  Respiratory:  Positive for cough. Negative for shortness of breath and wheezing.   Cardiovascular:  Negative for chest pain.  Gastrointestinal:  Negative for abdominal pain, diarrhea, nausea and vomiting.  Endocrine: Negative for polyuria.  Genitourinary:  Negative for dysuria, frequency and urgency.  Musculoskeletal:  Negative for arthralgias and myalgias.  Allergic/Immunologic: Negative for immunocompromised state.  Neurological:  Positive for headaches. Negative for dizziness, tremors, syncope, weakness and numbness.  Hematological:  Negative for adenopathy. Does not bruise/bleed easily.  Psychiatric/Behavioral:  Negative for dysphoric mood. The patient is not nervous/anxious.        Objective:   Physical Exam Constitutional:      General: He is not in acute distress.    Appearance: Normal appearance. He is well-developed. He is obese. He is not ill-appearing or diaphoretic.  HENT:  Head: Normocephalic and atraumatic.     Comments: No facial tenderness or swelling     Right Ear: External ear normal.     Left Ear: External ear normal.     Nose: Congestion and rhinorrhea present.     Mouth/Throat:     Mouth: Mucous membranes are moist.     Pharynx: No oropharyngeal exudate.  Eyes:     General:        Right eye: No discharge.        Left eye: No discharge.     Conjunctiva/sclera: Conjunctivae normal.     Pupils: Pupils are equal, round, and reactive to light.  Cardiovascular:     Rate and Rhythm: Normal rate and regular rhythm.  Pulmonary:      Effort: Pulmonary effort is normal. No respiratory distress.     Breath sounds: Normal breath sounds. No stridor. No wheezing, rhonchi or rales.  Musculoskeletal:        General: Normal range of motion.     Cervical back: Normal range of motion and neck supple.  Lymphadenopathy:     Cervical: No cervical adenopathy.  Skin:    General: Skin is warm and dry.     Findings: No erythema or rash.  Neurological:     Mental Status: He is alert.     Cranial Nerves: No cranial nerve deficit.           Assessment & Plan:   Problem List Items Addressed This Visit       Respiratory   Acute sinusitis - Primary   3 wk s/p uri  With sinus pressure/congestion and ear discomfort  Reassuring exam  Augmentin  sent to pharmacy  Also flonase  ns If not improving consider prednisone   Disc symptomatic care - see instructions on AVS  Update if not starting to improve in a week or if worsening  Call back and Er precautions noted in detail today        Relevant Medications   amoxicillin -clavulanate (AUGMENTIN ) 875-125 MG tablet   fluticasone  (FLONASE ) 50 MCG/ACT nasal spray      [1]  Social History Tobacco Use   Smoking status: Never    Passive exposure: Never   Smokeless tobacco: Never  Vaping Use   Vaping status: Never Used  Substance Use Topics   Alcohol use: Yes    Comment: Very rare   Drug use: No  [2] No Known Allergies [3]  Current Outpatient Medications on File Prior to Visit  Medication Sig Dispense Refill   amLODipine  (NORVASC ) 5 MG tablet TAKE 1 TABLET (5 MG TOTAL) BY MOUTH DAILY. 90 tablet 3   cyclobenzaprine  (FLEXERIL ) 10 MG tablet TAKE 1/2 TO 1 TABLET (5-10 MG TOTAL) BY MOUTH EVERY DAY AT BEDTIME AS NEEDED FOR MUSCLE SPASM 30 tablet 1   omeprazole  (PRILOSEC) 40 MG capsule Take 40 mg by mouth daily. (Patient taking differently: Take 40 mg by mouth daily. As needed)     SYRINGE-NEEDLE, DISP, 3 ML (SAFETY SYRINGE/NEEDLE) 21G X 1-1/2 3 ML MISC 1 each by Does not apply  route every 14 (fourteen) days. 2 each 12   No current facility-administered medications on file prior to visit.   "

## 2024-07-30 NOTE — Patient Instructions (Signed)
 Take augmentin  as directed for sinusitis Also flonase  spray daily for at least 2 weeks (it takes a while to work)   Breathing steam may help also   Update if not starting to improve in a week or if worsening

## 2024-07-30 NOTE — Assessment & Plan Note (Signed)
 3 wk s/p uri  With sinus pressure/congestion and ear discomfort  Reassuring exam  Augmentin  sent to pharmacy  Also flonase  ns If not improving consider prednisone   Disc symptomatic care - see instructions on AVS  Update if not starting to improve in a week or if worsening  Call back and Er precautions noted in detail today

## 2024-07-31 ENCOUNTER — Telehealth: Payer: Self-pay | Admitting: Podiatry

## 2024-07-31 ENCOUNTER — Encounter: Payer: Self-pay | Admitting: Podiatry

## 2024-07-31 NOTE — Telephone Encounter (Signed)
 Faxed [employer] [phone] work note for pt RTW 08/05/24 with accom/restr light duty, allow him to use a scooter, and allow him to elevate foot as needed during the day.

## 2024-07-31 NOTE — Telephone Encounter (Signed)
 Thank you for the update!

## 2024-07-31 NOTE — Telephone Encounter (Signed)
 Patient is requesting a return to work light duty date for 08/05/24:  Suggestion: as doing office work, being allowed to use scooter, time off for PT appointments and what other restrictions you suggest.   If approved can it be faxed to: [facility] employee medical service [phone]

## 2024-08-07 ENCOUNTER — Ambulatory Visit: Admitting: Podiatry

## 2024-08-07 ENCOUNTER — Encounter: Payer: Self-pay | Admitting: Family Medicine

## 2024-08-07 DIAGNOSIS — S86311D Strain of muscle(s) and tendon(s) of peroneal muscle group at lower leg level, right leg, subsequent encounter: Secondary | ICD-10-CM | POA: Diagnosis not present

## 2024-08-07 MED ORDER — PREDNISONE 10 MG PO TABS: ORAL_TABLET | ORAL | 0 refills | Status: AC

## 2024-08-07 NOTE — Progress Notes (Signed)
 He presents today for postop visit date of surgery was 06/28/2024 excision ganglion cyst and repair of his peroneal tendons.  He states that he has been doing fine in the cast he is ready to get out of it and is ready to start walking on his foot.  Objective: Vital signs are stable he is alert and oriented x 3.  Pulses are palpable.  Cast was intact once removed demonstrates dry scaly skin no staples or sutures intact.  Has no pain on palpation to the area mildly edematous has good abduction and plantarflexion with eversion against resistance.  Assessment: Well-healing surgical ankle right.  Plan: Put him in his cam boot and allow him to start walking on this I like to follow-up with him in 2 to 3 weeks at which time we will place him in a ankle stabilizer if possible and a tennis shoe.

## 2024-08-21 ENCOUNTER — Other Ambulatory Visit (HOSPITAL_BASED_OUTPATIENT_CLINIC_OR_DEPARTMENT_OTHER): Payer: Self-pay | Admitting: Family Medicine

## 2024-08-21 DIAGNOSIS — Z8249 Family history of ischemic heart disease and other diseases of the circulatory system: Secondary | ICD-10-CM

## 2024-08-27 ENCOUNTER — Ambulatory Visit: Admitting: Podiatry

## 2024-08-27 ENCOUNTER — Encounter: Payer: Self-pay | Admitting: Podiatry

## 2024-08-27 ENCOUNTER — Encounter: Payer: Self-pay | Admitting: Family Medicine

## 2024-08-27 DIAGNOSIS — S86311D Strain of muscle(s) and tendon(s) of peroneal muscle group at lower leg level, right leg, subsequent encounter: Secondary | ICD-10-CM

## 2024-08-27 DIAGNOSIS — M7989 Other specified soft tissue disorders: Secondary | ICD-10-CM

## 2024-08-28 NOTE — Progress Notes (Signed)
 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  He states that really not having any pain been wearing my boot and the tennis shoe at home I did purchase a ankle brace the laces up and has straps that come around and under the foot.  He states I am ready to get back to work full-time.  Objective: Vital signs are stable and oriented x 3 he has good range of motion and good strength against resistance.  There is no recurrence of the mass.  Assessment well-healing surgical ankle.  Plan: Allow him to get back to work utilizing his ankle brace and shoe gear from work.  I expressed to him that I do not want him without stability for at least another month I will follow-up with him in a month if necessary otherwise we will write him a note to go ahead and go back to work full duty.

## 2024-09-02 ENCOUNTER — Ambulatory Visit: Admitting: Family Medicine

## 2024-09-09 ENCOUNTER — Other Ambulatory Visit (HOSPITAL_BASED_OUTPATIENT_CLINIC_OR_DEPARTMENT_OTHER)

## 2024-09-25 ENCOUNTER — Encounter: Admitting: Podiatry

## 2024-10-21 ENCOUNTER — Encounter: Admitting: Family Medicine

## 2025-07-03 ENCOUNTER — Encounter: Admitting: Family Medicine
